# Patient Record
Sex: Female | Born: 1970 | Race: White | Hispanic: No | Marital: Married | Smoking: Never smoker
Health system: Southern US, Community
[De-identification: ages and names within clinical notes are randomized; demographics above are authoritative.]

## PROBLEM LIST (undated history)

## (undated) DIAGNOSIS — M1990 Unspecified osteoarthritis, unspecified site: Secondary | ICD-10-CM

## (undated) DIAGNOSIS — T7840XA Allergy, unspecified, initial encounter: Secondary | ICD-10-CM

## (undated) DIAGNOSIS — E785 Hyperlipidemia, unspecified: Secondary | ICD-10-CM

## (undated) DIAGNOSIS — D649 Anemia, unspecified: Secondary | ICD-10-CM

## (undated) DIAGNOSIS — K589 Irritable bowel syndrome without diarrhea: Secondary | ICD-10-CM

## (undated) DIAGNOSIS — K219 Gastro-esophageal reflux disease without esophagitis: Secondary | ICD-10-CM

## (undated) HISTORY — DX: Anemia, unspecified: D64.9

## (undated) HISTORY — DX: Unspecified osteoarthritis, unspecified site: M19.90

## (undated) HISTORY — DX: Irritable bowel syndrome, unspecified: K58.9

## (undated) HISTORY — DX: Hyperlipidemia, unspecified: E78.5

## (undated) HISTORY — DX: Gastro-esophageal reflux disease without esophagitis: K21.9

## (undated) HISTORY — DX: Allergy, unspecified, initial encounter: T78.40XA

---
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
## 2000-03-07 ENCOUNTER — Emergency Department (HOSPITAL_COMMUNITY): Admission: EM | Admit: 2000-03-07 | Discharge: 2000-03-07 | Payer: Self-pay | Admitting: Emergency Medicine

## 2000-08-06 ENCOUNTER — Other Ambulatory Visit: Admission: RE | Admit: 2000-08-06 | Discharge: 2000-08-06 | Payer: Self-pay | Admitting: *Deleted

## 2001-08-18 ENCOUNTER — Other Ambulatory Visit: Admission: RE | Admit: 2001-08-18 | Discharge: 2001-08-18 | Payer: Self-pay | Admitting: Obstetrics and Gynecology

## 2002-09-14 ENCOUNTER — Other Ambulatory Visit: Admission: RE | Admit: 2002-09-14 | Discharge: 2002-09-14 | Payer: Self-pay | Admitting: Obstetrics & Gynecology

## 2004-01-16 ENCOUNTER — Ambulatory Visit: Payer: Self-pay | Admitting: Family Medicine

## 2004-03-29 ENCOUNTER — Ambulatory Visit: Payer: Self-pay | Admitting: Internal Medicine

## 2004-04-24 ENCOUNTER — Ambulatory Visit: Payer: Self-pay | Admitting: Internal Medicine

## 2004-05-06 ENCOUNTER — Ambulatory Visit: Payer: Self-pay | Admitting: Internal Medicine

## 2004-05-09 ENCOUNTER — Ambulatory Visit: Payer: Self-pay | Admitting: Internal Medicine

## 2005-02-03 ENCOUNTER — Ambulatory Visit: Payer: Self-pay | Admitting: Internal Medicine

## 2005-02-24 ENCOUNTER — Ambulatory Visit: Payer: Self-pay | Admitting: Internal Medicine

## 2005-04-10 ENCOUNTER — Ambulatory Visit: Payer: Self-pay | Admitting: Internal Medicine

## 2005-06-24 ENCOUNTER — Ambulatory Visit: Payer: Self-pay | Admitting: Internal Medicine

## 2005-10-16 ENCOUNTER — Ambulatory Visit: Payer: Self-pay | Admitting: Internal Medicine

## 2005-11-12 ENCOUNTER — Ambulatory Visit: Payer: Self-pay | Admitting: Internal Medicine

## 2006-05-15 DIAGNOSIS — K589 Irritable bowel syndrome without diarrhea: Secondary | ICD-10-CM | POA: Insufficient documentation

## 2006-05-15 DIAGNOSIS — Z9189 Other specified personal risk factors, not elsewhere classified: Secondary | ICD-10-CM | POA: Insufficient documentation

## 2007-01-11 ENCOUNTER — Ambulatory Visit: Payer: Self-pay | Admitting: Internal Medicine

## 2007-01-11 DIAGNOSIS — J019 Acute sinusitis, unspecified: Secondary | ICD-10-CM | POA: Insufficient documentation

## 2007-06-10 ENCOUNTER — Telehealth (INDEPENDENT_AMBULATORY_CARE_PROVIDER_SITE_OTHER): Payer: Self-pay | Admitting: *Deleted

## 2007-11-19 ENCOUNTER — Encounter: Payer: Self-pay | Admitting: Internal Medicine

## 2008-03-27 ENCOUNTER — Telehealth (INDEPENDENT_AMBULATORY_CARE_PROVIDER_SITE_OTHER): Payer: Self-pay | Admitting: *Deleted

## 2008-03-28 ENCOUNTER — Ambulatory Visit: Payer: Self-pay | Admitting: Internal Medicine

## 2008-03-28 DIAGNOSIS — K3189 Other diseases of stomach and duodenum: Secondary | ICD-10-CM | POA: Insufficient documentation

## 2008-03-28 DIAGNOSIS — R1013 Epigastric pain: Secondary | ICD-10-CM

## 2008-03-28 DIAGNOSIS — E785 Hyperlipidemia, unspecified: Secondary | ICD-10-CM | POA: Insufficient documentation

## 2008-03-28 LAB — CONVERTED CEMR LAB: HDL goal, serum: 40 mg/dL

## 2009-04-13 ENCOUNTER — Encounter: Admission: RE | Admit: 2009-04-13 | Discharge: 2009-04-13 | Payer: Self-pay | Admitting: Sports Medicine

## 2009-06-06 ENCOUNTER — Ambulatory Visit: Payer: Self-pay | Admitting: Internal Medicine

## 2009-06-06 DIAGNOSIS — M2550 Pain in unspecified joint: Secondary | ICD-10-CM | POA: Insufficient documentation

## 2009-06-11 LAB — CONVERTED CEMR LAB: Uric Acid, Serum: 5.4 mg/dL (ref 2.4–7.0)

## 2010-03-21 NOTE — Assessment & Plan Note (Signed)
Summary: pain in both hands/kdc   Vital Signs:  Patient profile:   40 year old female Weight:      130.4 pounds Temp:     98.3 degrees F oral Pulse rate:   60 / minute Resp:     14 per minute BP sitting:   104 / 68  (left arm) Cuff size:   regular  Vitals Entered By: Shonna Chock (June 06, 2009 3:07 PM) CC: 1.) Pain in both hands (severe x 3 weeks)  2.)Pain in both little toes-feels inflammed Comments REVIEWED MED LIST, PATIENT AGREED DOSE AND INSTRUCTION CORRECT    CC:  1.) Pain in both hands (severe x 3 weeks)  2.)Pain in both little toes-feels inflammed.  History of Present Illness: Onset of aching pain in hands X 3 weeks, sharp with typing & using mouse.Small toes have  ached for same period but are improving . EA:VWUJW  or hot water &  NSAIDS with minimal benefit . Her mother  & MGM have  arthritis  diffusely.   Allergies (verified): No Known Drug Allergies  Review of Systems General:  Denies chills, fever, and sweats. Eyes:  Denies discharge and eye pain. CV:  Denies bluish discoloration of lips or nails; No Raynaud's  symptoms. GI:  Denies change in bowel habits, constipation, diarrhea, and yellowish skin color; no dark urine. GU:  Denies discharge and dysuria. MS:  Complains of joint pain and joint swelling; denies joint redness, low back pain, mid back pain, and thoracic pain. Derm:  Denies lesion(s) and rash.  Physical Exam  General:  well-nourished,in no acute distress; alert,appropriate and cooperative throughout examination Eyes:  No corneal or conjunctival inflammation noted.No icterus.Dorian Heckle Extremities:  No clubbing, cyanosis, edema, or deformity noted with normal full range of motion of all joints. Minor crepitus of knees w/o effusion  Skin:  Intact without suspicious lesions or rashes. No jaundice Cervical Nodes:  No lymphadenopathy noted Axillary Nodes:  No palpable lymphadenopathy   Impression & Recommendations:  Problem # 1:  ARTHRALGIA  (ICD-719.40)  Orders: Venipuncture (11914) TLB-Uric Acid, Blood (84550-URIC) TLB-Sedimentation Rate (ESR) (85652-ESR) TLB-Rheumatoid Factor (RA) (78295-AO)  Complete Medication List: 1)  Mucinex Nasal Spray  .... Prn 2)  Nasonex 50 Mcg/act Susp (Mometasone furoate) .... Use 2 sprays in each nostril daily 3)  Tramadol Hcl 50 Mg Tabs (Tramadol hcl) .Marland Kitchen.. 1 every 6 hrs as needed for pain  Patient Instructions: 1)  Glucosamine sulfate 1500 mg once daily X 3 months. This can be used cyclically , 3 mos on & 2 moths off. Prescriptions: TRAMADOL HCL 50 MG TABS (TRAMADOL HCL) 1 every 6 hrs as needed for pain  #30 x 1   Entered and Authorized by:   Marga Melnick MD   Signed by:   Marga Melnick MD on 06/06/2009   Method used:   Faxed to ...       [facility] # [address]* (retail)       [address]       [place], [place]  [address]       Ph: [phone]       Fax: [phone]   RxID:   [id number]

## 2010-05-10 ENCOUNTER — Encounter: Payer: Self-pay | Admitting: Internal Medicine

## 2012-05-27 ENCOUNTER — Encounter: Payer: Self-pay | Admitting: Internal Medicine

## 2013-06-09 ENCOUNTER — Encounter: Payer: Self-pay | Admitting: Internal Medicine

## 2015-11-02 DIAGNOSIS — J40 Bronchitis, not specified as acute or chronic: Secondary | ICD-10-CM | POA: Diagnosis not present

## 2015-11-02 DIAGNOSIS — J0110 Acute frontal sinusitis, unspecified: Secondary | ICD-10-CM | POA: Diagnosis not present

## 2015-11-09 DIAGNOSIS — Z6826 Body mass index (BMI) 26.0-26.9, adult: Secondary | ICD-10-CM | POA: Diagnosis not present

## 2015-11-09 DIAGNOSIS — Z01419 Encounter for gynecological examination (general) (routine) without abnormal findings: Secondary | ICD-10-CM | POA: Diagnosis not present

## 2015-11-09 DIAGNOSIS — Z1231 Encounter for screening mammogram for malignant neoplasm of breast: Secondary | ICD-10-CM | POA: Diagnosis not present

## 2015-11-19 LAB — HM MAMMOGRAPHY: HM Mammogram: NORMAL (ref 0–4)

## 2015-11-27 DIAGNOSIS — J0140 Acute pansinusitis, unspecified: Secondary | ICD-10-CM | POA: Diagnosis not present

## 2015-11-27 DIAGNOSIS — Z23 Encounter for immunization: Secondary | ICD-10-CM | POA: Diagnosis not present

## 2016-02-23 DIAGNOSIS — J0141 Acute recurrent pansinusitis: Secondary | ICD-10-CM | POA: Diagnosis not present

## 2016-02-29 DIAGNOSIS — J0100 Acute maxillary sinusitis, unspecified: Secondary | ICD-10-CM | POA: Diagnosis not present

## 2016-03-27 DIAGNOSIS — J329 Chronic sinusitis, unspecified: Secondary | ICD-10-CM | POA: Diagnosis not present

## 2016-07-11 DIAGNOSIS — Z0000 Encounter for general adult medical examination without abnormal findings: Secondary | ICD-10-CM | POA: Diagnosis not present

## 2016-07-12 LAB — HEPATIC FUNCTION PANEL
ALK PHOS: 44 (ref 25–125)
ALT: 15 (ref 7–35)
AST: 17 (ref 13–35)
Bilirubin, Direct: 0.2 (ref 0.01–0.4)

## 2016-07-12 LAB — BASIC METABOLIC PANEL
BUN: 16 (ref 4–21)
Creatinine: 0.8 (ref 0.5–1.1)
GLUCOSE: 100
POTASSIUM: 4.6 (ref 3.4–5.3)
Sodium: 141 (ref 137–147)

## 2016-07-12 LAB — CBC AND DIFFERENTIAL
HEMATOCRIT: 39 (ref 36–46)
Hemoglobin: 13 (ref 12.0–16.0)
Neutrophils Absolute: 43
Platelets: 306 (ref 150–399)
WBC: 6.9

## 2016-07-12 LAB — TSH: TSH: 2.19 (ref 0.41–5.90)

## 2016-07-12 LAB — LIPID PANEL
CHOLESTEROL: 268 — AB (ref 0–200)
HDL: 49 (ref 35–70)
LDL Cholesterol: 190
LDl/HDL Ratio: 3.9
Triglycerides: 144 (ref 40–160)

## 2016-07-25 DIAGNOSIS — Z713 Dietary counseling and surveillance: Secondary | ICD-10-CM | POA: Diagnosis not present

## 2016-08-04 ENCOUNTER — Encounter: Payer: Self-pay | Admitting: Family Medicine

## 2016-08-04 ENCOUNTER — Encounter: Payer: Self-pay | Admitting: *Deleted

## 2016-08-04 ENCOUNTER — Ambulatory Visit (INDEPENDENT_AMBULATORY_CARE_PROVIDER_SITE_OTHER): Payer: BLUE CROSS/BLUE SHIELD | Admitting: Family Medicine

## 2016-08-04 VITALS — BP 130/88 | HR 56 | Temp 98.4°F | Resp 20 | Ht 60.5 in | Wt 136.8 lb

## 2016-08-04 DIAGNOSIS — Z7689 Persons encountering health services in other specified circumstances: Secondary | ICD-10-CM | POA: Diagnosis not present

## 2016-08-04 DIAGNOSIS — Z6826 Body mass index (BMI) 26.0-26.9, adult: Secondary | ICD-10-CM

## 2016-08-04 DIAGNOSIS — E663 Overweight: Secondary | ICD-10-CM | POA: Insufficient documentation

## 2016-08-04 DIAGNOSIS — E785 Hyperlipidemia, unspecified: Secondary | ICD-10-CM

## 2016-08-04 NOTE — Patient Instructions (Addendum)
Fish oil supplement 1000 -2000 mg a day.   Exercise greater than 150 min of good cardio a week, more is better, but start here.   Myfittnesspal app download.   HDL (good cholesterol):olive oil, salmon, whole grains, beans/legumes and exercise.  URL to calorie calculator: http://www.calculator.net/calorie-calculator.html  Follow up in 6 months with fasting labs at that appt.     Food Choices to Lower Your Cholesterol Triglycerides are a type of fat in your blood. High levels of triglycerides can increase the risk of heart disease and stroke. If your triglyceride levels are high, the foods you eat and your eating habits are very important. Choosing the right foods can help lower your triglycerides. What general guidelines do I need to follow?  Lose weight if you are overweight.  Limit or avoid alcohol.  Fill one half of your plate with vegetables and green salads.  Limit fruit to two servings a day. Choose fruit instead of juice.  Make one fourth of your plate whole grains. Look for the word "whole" as the first word in the ingredient list.  Fill one fourth of your plate with lean protein foods.  Enjoy fatty fish (such as salmon, mackerel, sardines, and tuna) three times a week.  Choose healthy fats.  Limit foods high in starch and sugar.  Eat more home-cooked food and less restaurant, buffet, and fast food.  Limit fried foods.  Cook foods using methods other than frying.  Limit saturated fats.  Check ingredient lists to avoid foods with partially hydrogenated oils (trans fats) in them. What foods can I eat? Grains Whole grains, such as whole wheat or whole grain breads, crackers, cereals, and pasta. Unsweetened oatmeal, bulgur, barley, quinoa, or brown rice. Corn or whole wheat flour tortillas. Vegetables Fresh or frozen vegetables (raw, steamed, roasted, or grilled). Green salads. Fruits All fresh, canned (in natural juice), or frozen fruits. Meat and Other  Protein Products Ground beef (85% or leaner), grass-fed beef, or beef trimmed of fat. Skinless chicken or [place]. Ground chicken or [place]. Pork trimmed of fat. All fish and seafood. Eggs. Dried beans, peas, or lentils. Unsalted nuts or seeds. Unsalted canned or dry beans. Dairy Low-fat dairy products, such as skim or 1% milk, 2% or reduced-fat cheeses, low-fat ricotta or cottage cheese, or plain low-fat yogurt. Fats and Oils Tub margarines without trans fats. Light or reduced-fat mayonnaise and salad dressings. Avocado. Safflower, olive, or canola oils. Natural peanut or almond butter. The items listed above may not be a complete list of recommended foods or beverages. Contact your dietitian for more options. What foods are not recommended? Grains White bread. White pasta. White rice. Cornbread. Bagels, pastries, and croissants. Crackers that contain trans fat. Vegetables White potatoes. Corn. Creamed or fried vegetables. Vegetables in a cheese sauce. Fruits Dried fruits. Canned fruit in light or heavy syrup. Fruit juice. Meat and Other Protein Products Fatty cuts of meat. Ribs, chicken wings, bacon, sausage, bologna, salami, chitterlings, fatback, hot dogs, bratwurst, and packaged luncheon meats. Dairy Whole or 2% milk, cream, half-and-half, and cream cheese. Whole-fat or sweetened yogurt. Full-fat cheeses. Nondairy creamers and whipped toppings. Processed cheese, cheese spreads, or cheese curds. Sweets and Desserts Corn syrup, sugars, honey, and molasses. Candy. Jam and jelly. Syrup. Sweetened cereals. Cookies, pies, cakes, donuts, muffins, and ice cream. Fats and Oils Butter, stick margarine, lard, shortening, ghee, or bacon fat. Coconut, palm kernel, or palm oils. Beverages Alcohol. Sweetened drinks (such as sodas, lemonade, and fruit drinks or punches). The items listed  above may not be a complete list of foods and beverages to avoid. Contact your dietitian for more information. This  information is not intended to replace advice given to you by your health care provider. Make sure you discuss any questions you have with your health care provider. Document Released: 11/22/2003 Document Revised: 07/12/2015 Document Reviewed: 12/08/2012 Elsevier Interactive Patient Education  2017 [place].   Please help [place] help you:  We are honored you have chosen [facility] for your Primary Care home. Below you will find basic instructions that you may need to access in the future. Please help [place] help you by reading the instructions, which cover many of the frequent questions we experience.   Prescription refills and request:  -In order to allow more efficient response time, please call your pharmacy for all refills. They will forward the request electronically to [place]. This allows for the quickest possible response. Request left on a nurse line can take longer to refill, since these are checked as time allows between office patients and other phone calls.  - refill request can take up to 3-5 working days to complete.  - If request is sent electronically and request is appropiate, it is usually completed in 1-2 business days.  - all patients will need to be seen routinely for all chronic medical conditions requiring prescription medications (see follow-up below). If you are overdue for follow up on your condition, you will be asked to make an appointment and we will call in enough medication to cover you until your appointment (up to 30 days).  - all controlled substances will require a face to face visit to request/refill.  - if you desire your prescriptions to go through a new pharmacy, and have an active script at original pharmacy, you will need to call your pharmacy and have scripts transferred to new pharmacy. This is completed between the pharmacy locations and not by your provider.    Results: If any images or labs were ordered, it can take up to 1 week to get results depending  on the test ordered and the lab/facility running and resulting the test. - Normal or stable results, which do not need further discussion, may be released to your mychart immediately with attached note to you. A call may not be generated for normal results. Please make certain to sign up for mychart. If you have questions on how to activate your mychart you can call the front office.  - If your results need further discussion, our office will attempt to contact you via phone, and if unable to reach you after 2 attempts, we will release your abnormal result to your mychart with instructions.  - All results will be automatically released in mychart after 1 week.  - Your provider will provide you with explanation and instruction on all relevant material in your results. Please keep in mind, results and labs may appear confusing or abnormal to the untrained eye, but it does not mean they are actually abnormal for you personally. If you have any questions about your results that are not covered, or you desire more detailed explanation than what was provided, you should make an appointment with your provider to do so.   Our office handles many outgoing and incoming calls daily. If we have not contacted you within 1 week about your results, please check your mychart to see if there is a message first and if not, then contact our office.  In helping with this matter,  you help decrease call volume, and therefore allow [place] to be able to respond to patients needs more efficiently.   Acute office visits (sick visit):  An acute visit is intended for a new problem and are scheduled in shorter time slots to allow schedule openings for patients with new problems. This is the appropriate visit to discuss a new problem. In order to provide you with excellent quality medical care with proper time for you to explain your problem, have an exam and receive treatment with instructions, these appointments should be limited to one  new problem per visit. If you experience a new problem, in which you desire to be addressed, please make an acute office visit, we save openings on the schedule to accommodate you. Please do not save your new problem for any other type of visit, let us take care of it properly and quickly for you.   Follow up visits:  Depending on your condition(s) your provider will need to see you routinely in order to provide you with quality care and prescribe medication(s). Most chronic conditions (Example: hypertension, Diabetes, depression/anxiety... etc), require visits a couple times a year. Your provider will instruct you on proper follow up for your personal medical conditions and history. Please make certain to make follow up appointments for your condition as instructed. Failing to do so could result in lapse in your medication treatment/refills. If you request a refill, and are overdue to be seen on a condition, we will always provide you with a 30 day script (once) to allow you time to schedule.    Medicare wellness (well visit): - we have a wonderful Nurse Maudie Mercury), that will meet with you and provide you will yearly medicare wellness visits. These visits should occur yearly (can not be scheduled less than 1 calendar year apart) and cover preventive health, immunizations, advance directives and screenings you are entitled to yearly through your medicare benefits. Do not miss out on your entitled benefits, this is when medicare will pay for these benefits to be ordered for you.  These are strongly encouraged by your provider and is the appropriate type of visit to make certain you are up to date with all preventive health benefits. If you have not had your medicare wellness exam in the last 12 months, please make certain to schedule one by calling the office and schedule your medicare wellness with Maudie Mercury as soon as possible.   Yearly physical (well visit):  - Adults are recommended to be seen yearly for  physicals. Check with your insurance and date of your last physical, most insurances require one calendar year between physicals. Physicals include all preventive health topics, screenings, medical exam and labs that are appropriate for gender/age and history. You may have fasting labs needed at this visit. This is a well visit (not a sick visit), new problems should not be covered during this visit (see acute visit).  - Pediatric patients are seen more frequently when they are younger. Your provider will advise you on well child visit timing that is appropriate for your their age. - This is not a medicare wellness visit. Medicare wellness exams do not have an exam portion to the visit. Some medicare companies allow for a physical, some do not allow a yearly physical. If your medicare allows a yearly physical you can schedule the medicare wellness with our nurse Maudie Mercury and have your physical with your provider after, on the same day. Please check with insurance for your full benefits.  Late Policy/No Shows:  - all new patients should arrive 15-30 minutes earlier than appointment to allow [place] time  to  obtain all personal demographics,  insurance information and for you to complete office paperwork. - All established patients should arrive 10-15 minutes earlier than appointment time to update all information and be checked in .  - In our best efforts to run on time, if you are late for your appointment you will be asked to either reschedule or if able, we will work you back into the schedule. There will be a wait time to work you back in the schedule,  depending on availability.  - If you are unable to make it to your appointment as scheduled, please call 24 hours ahead of time to allow [place] to fill the time slot with someone else who needs to be seen. If you do not cancel your appointment ahead of time, you may be charged a no show fee.

## 2016-08-04 NOTE — Progress Notes (Addendum)
Patient ID: Shelly Hernandez, female  DOB: 01-04-1971, 46 y.o.   MRN: [id number] Patient Care Team    Relationship Specialty Notifications Start End  Ma Hillock, DO PCP - General Family Medicine  08/04/16   Brien Few, MD Consulting Physician Obstetrics and Gynecology  08/04/16     Chief Complaint  Patient presents with  . Establish Care    Subjective:  Shelly Hernandez is a 46 y.o.  female present for new patient establishment. All past medical history, surgical history, allergies, family history, immunizations, medications and social history were obtained and entered in the electronic medical record today. All recent labs, ED visits and hospitalizations within the last year were reviewed.  Hyperlipidemia:  Pt brings with her outside labs collected at her employment with elevated cholesterol panel. She states she has been watching her cholesterol over the last few years and has tried to control it with diet and exercise. Her family has a history of elevated cholesterol and hypertension so she has concerns that her last collection was increasing more. She was seen by a nutritionist to give guidance on hyperlipidemia and diet. Total cholesterol 268, trig 144, LDL 190 in May 2018. TSH, CBC unremarkable.   Health maintenance:  Colonoscopy: No fhx, routine screen at 49 Mammogram: completed:11/2015, by GYN, Dr. Edwyna Ready which she follows routinely. Cervical cancer screening: last pap: 11/18/2015, Follows with GYn, Dr Edwyna Ready.  Immunizations: tdap unknown, likely received during pregnancy, Influenza  (encouraged yearly) Infectious disease screening: HIV unknown screening, likely completed with pregnancy.  DEXA: N/A   Depression screen [facility] 2/9 08/04/2016  Decreased Interest 0  Down, Depressed, Hopeless 0  PHQ - 2 Score 0   No flowsheet data found.   Current Exercise Habits: Structured exercise class;Home exercise routine, Type of exercise: walking, Time (Minutes): 45, Frequency  (Times/Week): 3, Weekly Exercise (Minutes/Week): 135, Intensity: Moderate Exercise limited by: None identified Fall Risk  08/04/2016  Falls in the past year? No    There is no immunization history on file for this patient.  No exam data present  Past Medical History:  Diagnosis Date  . Allergy   . Hyperlipidemia LDL goal <130   . IBS (irritable bowel syndrome)    Allergies  Allergen Reactions  . Augmentin [Amoxicillin-Pot Clavulanate] Diarrhea   Past Surgical History:  Procedure Laterality Date  . REDUCTION MAMMAPLASTY  1990   Family History  Problem Relation Age of Onset  . Diabetes Mother   . Arthritis Mother   . Diabetes Father   . Hearing loss Father   . Heart disease Father   . Bipolar disorder Son   . Diabetes Maternal Aunt   . Arthritis Maternal Aunt   . Heart disease Maternal Uncle   . Diabetes Paternal Aunt   . Stroke Maternal Grandmother   . Arthritis Maternal Grandmother   . Esophageal cancer Maternal Grandfather   . COPD Maternal Grandfather   . Diabetes Paternal Grandmother   . Heart disease Paternal Grandfather    Social History   Social History  . Marital status: Married    Spouse name: Herbie Baltimore  . Number of children: 1  . Years of education: 65   Occupational History  . Banker    Social History Main Topics  . Smoking status: Never Smoker  . Smokeless tobacco: Never Used  . Alcohol use 3.6 oz/week    4 Cans of beer, 2 Shots of liquor per week  . Drug use: No  . Sexual activity:  Yes    Partners: Male    Birth control/ protection: IUD   Other Topics Concern  . Not on file   Social History Narrative   Married to [place]. 1 son Hulen Skains.    B.A. Degree, works as a Customer service manager.    Drinks caffeine. Daily vitamin use.   Wears her seatbelt, bicycle helmet, smoke detector in the home. Firearms locked in the home.    Feels safe in her relationships.    Allergies as of 08/04/2016      Reactions   Augmentin [amoxicillin-pot Clavulanate] Diarrhea       Medication List       Accurate as of 08/04/16 11:56 AM. Always use your most recent med list.          cetirizine 10 MG tablet Commonly known as:  ZYRTEC Take 10 mg by mouth daily.   fluticasone 50 MCG/ACT nasal spray Commonly known as:  FLONASE Place into the nose.       All past medical history, surgical history, allergies, family history, immunizations andmedications were updated in the EMR today and reviewed under the history and medication portions of their EMR.    Recent Results (from the past 2160 hour(s))  CBC and differential     Status: None   Collection Time: 07/12/16 12:00 AM  Result Value Ref Range   Hemoglobin 13.0 12.0 - 16.0   HCT 39 36 - 46   Neutrophils Absolute 43    Platelets 306 150 - 399   WBC 6.9   Basic metabolic panel     Status: None   Collection Time: 07/12/16 12:00 AM  Result Value Ref Range   Glucose 100    BUN 16 4 - 21   Creatinine 0.8 0.5 - 1.1   Potassium 4.6 3.4 - 5.3   Sodium 141 137 - 147  Lipid panel     Status: Abnormal   Collection Time: 07/12/16 12:00 AM  Result Value Ref Range   LDl/HDL Ratio 3.9    Triglycerides 144 40 - 160   Cholesterol 268 (A) 0 - 200   HDL 49 35 - 70   LDL Cholesterol 190   Hepatic function panel     Status: None   Collection Time: 07/12/16 12:00 AM  Result Value Ref Range   Alkaline Phosphatase 44 25 - 125   ALT 15 7 - 35   AST 17 13 - 35   Bilirubin, Direct 0.2 0.01 - 0.4  TSH     Status: None   Collection Time: 07/12/16 12:00 AM  Result Value Ref Range   TSH 2.19 0.41 - 5.90    No results found.   ROS: 14 pt review of systems performed and negative (unless mentioned in an HPI)  Objective: BP 130/88 (BP Location: Left Arm, Patient Position: Sitting, Cuff Size: Normal)   Pulse (!) 56   Temp 98.4 F (36.9 C)   Resp 20   Ht 5' 0.5" (1.537 m)   Wt 136 lb 12 oz (62 kg)   LMP 07/04/2016   SpO2 97%   BMI 26.27 kg/m  Gen: Afebrile. No acute distress. Nontoxic in appearance,  well-developed, well-nourished,  Very pleasant caucasian female.  HENT: AT. Page. MMM, no oral lesions Eyes:Pupils Equal Round Reactive to light, Extraocular movements intact,  Conjunctiva without redness, discharge or icterus. Neck/lymp/endocrine: Supple,no lymphadenopathy, no thyromegaly CV: RRR no murmur, no edema, +2/4 P posterior tibialis pulses. no carotid bruits. No JVD. Chest: CTAB, no wheeze, rhonchi or  crackles.  Abd: Soft. NTND. BS present. Skin: Warm and well-perfused. Skin intact. Neuro/Msk:  Normal gait. PERLA. EOMi. Alert. Oriented x3.   Psych: Normal affect, dress and demeanor. Normal speech. Normal thought content and judgment.   Assessment/plan: TARIKA MCKETHAN is a 46 y.o. female present to establish care and discuss her hyperlipidemia.  Hyperlipidemia LDL goal <130 BMI 26.0-26.9,adult - pt reports elevated cholesterol in the past, but recent labs suggest even higher counts than prior.  - in depth discussion in diet low in saturated fats, high in fiber and "good" forms of cholesterol, exercise > 150 min a week to start, calorie calculator, myfittness pal app.  - start fish oil supplement 1000-2000 mg  A day.  - she will follow up in 6 months with repeat fasting labs that day.    Return in about 6 months (around 02/03/2017) for hyperlipidemia.   Note is dictated utilizing voice recognition software. Although note has been proof read prior to signing, occasional typographical errors still can be missed. If any questions arise, please do not hesitate to call for verification.  Electronically signed by: Howard Pouch, DO [place]

## 2016-09-05 DIAGNOSIS — Z713 Dietary counseling and surveillance: Secondary | ICD-10-CM | POA: Diagnosis not present

## 2016-11-28 DIAGNOSIS — Z23 Encounter for immunization: Secondary | ICD-10-CM | POA: Diagnosis not present

## 2016-12-31 DIAGNOSIS — M25511 Pain in right shoulder: Secondary | ICD-10-CM | POA: Diagnosis not present

## 2017-01-06 DIAGNOSIS — Z1151 Encounter for screening for human papillomavirus (HPV): Secondary | ICD-10-CM | POA: Diagnosis not present

## 2017-01-06 DIAGNOSIS — Z01419 Encounter for gynecological examination (general) (routine) without abnormal findings: Secondary | ICD-10-CM | POA: Diagnosis not present

## 2017-01-06 DIAGNOSIS — Z1231 Encounter for screening mammogram for malignant neoplasm of breast: Secondary | ICD-10-CM | POA: Diagnosis not present

## 2017-01-06 DIAGNOSIS — Z6827 Body mass index (BMI) 27.0-27.9, adult: Secondary | ICD-10-CM | POA: Diagnosis not present

## 2017-01-06 LAB — HM MAMMOGRAPHY

## 2017-01-13 ENCOUNTER — Encounter: Payer: Self-pay | Admitting: *Deleted

## 2017-01-13 DIAGNOSIS — M25511 Pain in right shoulder: Secondary | ICD-10-CM | POA: Diagnosis not present

## 2017-01-13 DIAGNOSIS — M25512 Pain in left shoulder: Secondary | ICD-10-CM | POA: Diagnosis not present

## 2017-02-03 ENCOUNTER — Ambulatory Visit: Payer: BLUE CROSS/BLUE SHIELD | Admitting: Family Medicine

## 2017-02-03 ENCOUNTER — Encounter: Payer: Self-pay | Admitting: Family Medicine

## 2017-02-03 VITALS — BP 122/73 | HR 57 | Temp 97.9°F | Wt 134.0 lb

## 2017-02-03 DIAGNOSIS — E785 Hyperlipidemia, unspecified: Secondary | ICD-10-CM | POA: Diagnosis not present

## 2017-02-03 DIAGNOSIS — Z713 Dietary counseling and surveillance: Secondary | ICD-10-CM

## 2017-02-03 DIAGNOSIS — E663 Overweight: Secondary | ICD-10-CM | POA: Diagnosis not present

## 2017-02-03 LAB — LIPID PANEL
CHOLESTEROL: 186 mg/dL (ref 0–200)
HDL: 58.3 mg/dL (ref >39.00)
LDL Cholesterol: 111 mg/dL — ABNORMAL HIGH (ref 0–99)
NONHDL: 127.28
Total CHOL/HDL Ratio: 3
Triglycerides: 79 mg/dL (ref 0.0–149.0)
VLDL: 15.8 mg/dL (ref 0.0–40.0)

## 2017-02-03 NOTE — Progress Notes (Signed)
Patient ID: Shelly Hernandez, female  DOB: [birth year], 46 y.o.   MRN: [id number] Patient Care Team    Relationship Specialty Notifications Start End  Ma Hillock, DO PCP - General Family Medicine  08/04/16   Brien Few, MD Consulting Physician Obstetrics and Gynecology  08/04/16   Melida Quitter, MD Consulting Physician Otolaryngology  02/03/17     Chief Complaint  Patient presents with  . Hyperlipidemia    follow up    Subjective:  Shelly Hernandez is a 46 y.o.  female present for follow up 6 months on new hyperlipdemia  Hyperlipidemia: Patient was advised on lifestyle changes and start fish oil supplement of 1-2 g 6 months ago after hyperlipidemia diagnosis. Her total chol 268, HDL 49, LDL 190, tg 144 07/12/2016. She had also been counseled by nutrition. Fhx of heart disease, HTN, hyperlipidemia and stroke.  She reports starting 1000 mg fish oil supplement QD. She has tried many different diets and still feels confused on what foods to eat to lose weight and be healthy. She has seen nutrition in counseled, and was advised to smaller more frequent meals. She has tried low carbohydrate, feel free, gluten-free now she has moved onto a pescatarian like diet. She reports she'll still eat me on rare occasions when eating in a group and it is prepared. She has not maintain routine exercise. She reports is very difficult for her to wedge out time 2 workout. She tried myfittnesspal app, but felt that she was over obcessing about her calories.    Prior Note 08/04/2016: Pt brings with her outside labs collected at her employment with elevated cholesterol panel. She states she has been watching her cholesterol over the last few years and has tried to control it with diet and exercise. Her family has a history of elevated cholesterol and hypertension so she has concerns that her last collection was increasing more. She was seen by a nutritionist to give guidance on hyperlipidemia and diet. Total  cholesterol 268, trig 144, LDL 190 in May 2018. TSH, CBC unremarkable.    Depression screen [facility] 2/9 08/04/2016  Decreased Interest 0  Down, Depressed, Hopeless 0  PHQ - 2 Score 0   No flowsheet data found.       Fall Risk  08/04/2016  Falls in the past year? No    There is no immunization history on file for this patient.  No exam data present  Past Medical History:  Diagnosis Date  . Allergy   . Hyperlipidemia LDL goal <130   . IBS (irritable bowel syndrome)    Allergies  Allergen Reactions  . Augmentin [Amoxicillin-Pot Clavulanate] Diarrhea   Past Surgical History:  Procedure Laterality Date  . REDUCTION MAMMAPLASTY  1990   Family History  Problem Relation Age of Onset  . Diabetes Mother   . Arthritis Mother   . Diabetes Father   . Hearing loss Father   . Heart disease Father   . Bipolar disorder Son   . Diabetes Maternal Aunt   . Arthritis Maternal Aunt   . Heart disease Maternal Uncle   . Diabetes Paternal Aunt   . Stroke Maternal Grandmother   . Arthritis Maternal Grandmother   . Esophageal cancer Maternal Grandfather   . COPD Maternal Grandfather   . Diabetes Paternal Grandmother   . Heart disease Paternal Grandfather    Social History   Socioeconomic History  . Marital status: Married    Spouse name: Herbie [place]  . Number  of children: 1  . Years of education: 10  . Highest education level: Not on file  Social Needs  . Financial resource strain: Not on file  . Food insecurity - worry: Not on file  . Food insecurity - inability: Not on file  . Transportation needs - medical: Not on file  . Transportation needs - non-medical: Not on file  Occupational History  . Occupation: Banker  Tobacco Use  . Smoking status: Never Smoker  . Smokeless tobacco: Never Used  Substance and Sexual Activity  . Alcohol use: Yes    Alcohol/week: 3.6 oz    Types: 4 Cans of beer, 2 Shots of liquor per week  . Drug use: No  . Sexual activity: Yes    Partners: Male      Birth control/protection: IUD  Other Topics Concern  . Not on file  Social History Narrative   Married to [place]. 1 son Hulen Skains.    B.A. Degree, works as a Customer service manager.    Drinks caffeine. Daily vitamin use.   Wears her seatbelt, bicycle helmet, smoke detector in the home. Firearms locked in the home.    Feels safe in her relationships.    Allergies as of 02/03/2017      Reactions   Augmentin [amoxicillin-pot Clavulanate] Diarrhea      Medication List        Accurate as of 02/03/17 11:47 AM. Always use your most recent med list.          ALEVE 220 MG tablet Generic drug:  naproxen sodium Take 220 mg by mouth daily.   BENADRYL ALLERGY PO Take 25 mg by mouth daily.   fluticasone 50 MCG/ACT nasal spray Commonly known as:  FLONASE Place into the nose.   Omega 3 1000 MG Caps Take 1 capsule by mouth daily.   pseudoephedrine 30 MG tablet Commonly known as:  SUDAFED Take 30 mg by mouth daily.   TURMERIC PO Take 2 tablets by mouth daily.       All past medical history, surgical history, allergies, family history, immunizations andmedications were updated in the EMR today and reviewed under the history and medication portions of their EMR.    Recent Results (from the past 2160 hour(s))  HM MAMMOGRAPHY     Status: None   Collection Time: 01/06/17 12:00 AM  Result Value Ref Range   HM Mammogram 0-4 Bi-Rad 0-4 Bi-Rad, Self Reported Normal    No results found.   ROS: 14 pt review of systems performed and negative (unless mentioned in an HPI)  Objective: BP 122/73 (BP Location: Right Arm, Patient Position: Sitting, Cuff Size: Normal)   Pulse (!) 57   Temp 97.9 F (36.6 C) (Oral)   Wt 134 lb (60.8 kg)   SpO2 97%   BMI 25.74 kg/m  Gen: Afebrile. No acute distress. Nontoxic in appearance, well-developed, well-nourished, Caucasian female. HENT: AT. [place].  MMM. Eyes:Pupils Equal Round Reactive to light, Extraocular movements intact,  Conjunctiva without redness,  discharge or icterus. CV: RRR no murmur, no edema, +2/4 P posterior tibialis pulses Chest: CTAB, no wheeze or crackles Abd: Soft. NTND. BS present. No Masses palpated.  Neuro:  Normal gait. PERLA. EOMi. Alert. Oriented x3   Assessment/plan: Shelly Hernandez is a 46 y.o. female present to  Hyperlipidemia LDL goal <130 Overweight (BMI 25.0-29.9) Nutritional counseling - Again, lengthy conversation surrounding nutrition, diet and exercise lifestyle modifications needed in order to maintain a healthy diet, lower cholesterol and lose weight. Counseled on normal  BMI is below 25. She has lost 3 pounds. - Strongly encourage her to eat small more frequent meals and start routine exercise greater than 150 minutes a week. - Continue watching her calories, calories in and calories burned her appointment when trying to lose weight. - Discussed healthy diet. She plans to be mostly vegetable and fish base diet, without routine meats would encourage her to make sure to take a multivitamin with B 12 and iron.  - Discussed glycemic index with her today. Her body's needs carbohydrates and fats. Feel. However it is healthier to get these from specific sources. [facility] education on nutrition and glycemic index printed and provided to patient today. URL for a nutrition website so that she can compare calories and glycemic indexes of different foods to help her learn, was provided to her today. - Continue fish oil 1000 mg for now, lipid panel collected today. Patient will be guided on further management once lab results are collected. - Follow-up 6 months, this will likely be her physical.   No Follow-up on file.  > 25 minutes spent with patient, >50% of time spent face to face counseling/educating  Note is dictated utilizing voice recognition software. Although note has been proof read prior to signing, occasional typographical errors still can be missed. If any questions arise, please do not hesitate to  call for verification.  Electronically signed by: Howard Pouch, DO [place]

## 2017-02-03 NOTE — Patient Instructions (Addendum)
We will call you with the results of your lab once we receive them.  [email or web address]     Cholesterol Cholesterol is a fat. Your body needs a small amount of cholesterol. Cholesterol (plaque) may build up in your blood vessels (arteries). That makes you more likely to have a heart attack or stroke. You cannot feel your cholesterol level. Having a blood test is the only way to find out if your level is high. Keep your test results. Work with your doctor to keep your cholesterol at a good level. What do the results mean?  Total cholesterol is how much cholesterol is in your blood.  LDL is bad cholesterol. This is the type that can build up. Try to have low LDL.  HDL is good cholesterol. It cleans your blood vessels and carries LDL away. Try to have high HDL.  Triglycerides are fat that the body can store or burn for energy. What are good levels of cholesterol?  Total cholesterol below 200.  LDL below 100 is good for people who have health risks. LDL below 70 is good for people who have very high risks.  HDL above 40 is good. It is best to have HDL of 60 or higher.  Triglycerides below 150. How can I lower my cholesterol? Diet Follow your diet program as told by your doctor.  Choose fish, white meat chicken, or [place] that is roasted or baked. Try not to eat red meat, fried foods, sausage, or lunch meats.  Eat lots of fresh fruits and vegetables.  Choose whole grains, beans, pasta, potatoes, and cereals.  Choose olive oil, corn oil, or canola oil. Only use small amounts.  Try not to eat butter, mayonnaise, shortening, or palm kernel oils.  Try not to eat foods with trans fats.  Choose low-fat or nonfat dairy foods. ? Drink skim or nonfat milk. ? Eat low-fat or nonfat yogurt and cheeses. ? Try not to drink whole milk or cream. ? Try not to eat ice cream, egg yolks, or full-fat cheeses.  Healthy desserts include angel food  cake, ginger snaps, animal crackers, hard candy, popsicles, and low-fat or nonfat frozen yogurt. Try not to eat pastries, cakes, pies, and cookies.  Exercise Follow your exercise program as told by your doctor.  Be more active. Try gardening, walking, and taking the stairs.  Ask your doctor about ways that you can be more active.  Medicine  Take over-the-counter and prescription medicines only as told by your doctor.  This information is not intended to replace advice given to you by your health care provider. Make sure you discuss any questions you have with your health care provider. Document Released: 05/02/2008 Document Revised: 09/05/2015 Document Reviewed: 08/16/2015 Elsevier Interactive Patient Education  2017 [employer].

## 2017-03-18 DIAGNOSIS — J329 Chronic sinusitis, unspecified: Secondary | ICD-10-CM | POA: Diagnosis not present

## 2017-05-18 ENCOUNTER — Encounter: Payer: Self-pay | Admitting: Family Medicine

## 2017-05-18 ENCOUNTER — Ambulatory Visit: Payer: BLUE CROSS/BLUE SHIELD | Admitting: Family Medicine

## 2017-05-18 VITALS — BP 135/76 | HR 55 | Temp 98.3°F | Resp 16 | Ht 60.5 in | Wt 135.1 lb

## 2017-05-18 DIAGNOSIS — S93509A Unspecified sprain of unspecified toe(s), initial encounter: Secondary | ICD-10-CM | POA: Diagnosis not present

## 2017-05-18 DIAGNOSIS — J0100 Acute maxillary sinusitis, unspecified: Secondary | ICD-10-CM

## 2017-05-18 MED ORDER — CLINDAMYCIN HCL 300 MG PO CAPS: 300.0000 mg | ORAL_CAPSULE | Freq: Three times a day (TID) | ORAL | 0 refills | Status: DC

## 2017-05-18 NOTE — Progress Notes (Signed)
OFFICE VISIT  05/18/2017   CC:  Chief Complaint  Patient presents with  . Toe Pain    Left 4th digit   . URI     HPI:    Patient is a 47 y.o.  female who presents for toe pain as well as respiratory symptoms. Left 4th toe, stubbed toe on shoe on floor, toe twisted/wrenched in the process. Hurt right away, swelling a little while after, black and blue by evening. More swollen today.  Has been buddy taping it.   Took aleve.  Has pain in sinuses, runny nose, nasal congestion, two weeks of sx's.   HA, tired, no cough or ST.  No fever, no SOB or wheeze. Trying flonase, sudafed-no improvement.   Past Medical History:  Diagnosis Date  . Allergy   . Hyperlipidemia LDL goal <130   . IBS (irritable bowel syndrome)     Past Surgical History:  Procedure Laterality Date  . REDUCTION MAMMAPLASTY  1990    Outpatient Medications Prior to Visit  Medication Sig Dispense Refill  . cetirizine (ZYRTEC) 10 MG tablet Take 10 mg by mouth daily.    . DiphenhydrAMINE HCl (BENADRYL ALLERGY PO) Take 25 mg by mouth daily.    . fluticasone (FLONASE) 50 MCG/ACT nasal spray Place into the nose.    . levonorgestrel (MIRENA) 20 MCG/24HR IUD by Intrauterine route.    . naproxen sodium (ALEVE) 220 MG tablet Take 220 mg by mouth daily.    . Omega 3 1000 MG CAPS Take 1 capsule by mouth daily.    . pseudoephedrine (SUDAFED) 30 MG tablet Take 30 mg by mouth daily.    . TURMERIC PO Take 2 tablets by mouth daily.     No facility-administered medications prior to visit.     Allergies  Allergen Reactions  . Augmentin [Amoxicillin-Pot Clavulanate] Diarrhea    ROS As per HPI  PE: Blood pressure 135/76, pulse (!) 55, temperature 98.3 F (36.8 C), temperature source Oral, resp. rate 16, height 5' 0.5" (1.537 m), weight 135 lb 2 oz (61.3 kg), SpO2 100 %. VS: noted--normal. Gen: alert, NAD, NONTOXIC APPEARING. HEENT: eyes without injection, drainage, or swelling.  Ears: EACs clear, TMs with normal  light reflex and landmarks.  Nose: Clear rhinorrhea, with some dried, crusty exudate adherent to mildly injected mucosa.  No purulent d/c.  No paranasal sinus TTP.  No facial swelling.  Throat and mouth without focal lesion.  No pharyngial swelling, erythema, or exudate.   Neck: supple, no LAD.   LUNGS: CTA bilat, nonlabored resps.   CV: RRR, no m/r/g. EXT: no c/c/e.  Right 4th toe with mild soft tissue swelling and ecchymoses over DIP joint.  ROM of all toes intact. SKIN: no rash   LABS:    Chemistry      Component Value Date/Time   NA 141 07/12/2016   K 4.6 07/12/2016   BUN 16 07/12/2016   CREATININE 0.8 07/12/2016   GLU 100 07/12/2016      Component Value Date/Time   ALKPHOS 44 07/12/2016   AST 17 07/12/2016   ALT 15 07/12/2016      IMPRESSION AND PLAN:  1) Left 4th toe sprain: low suspicion of fracture. Rest--post op shoe fitted/dispensed in office today. Ice 20 min bid. Aleve 440mg  bid x 7d.   Buddy tape to 3rd or 5th toe.  2) Acute maxillary sinusitis: penicillin intolerance. Clindamycin 300 mg tid x 10d. OTC symptomatic care discussed.  An After Visit Summary was printed and  given to the patient.  FOLLOW UP: Return if symptoms worsen or fail to improve.  Signed:  Crissie Sickles, MD           05/18/2017

## 2017-06-13 DIAGNOSIS — Z23 Encounter for immunization: Secondary | ICD-10-CM | POA: Diagnosis not present

## 2017-08-22 DIAGNOSIS — N390 Urinary tract infection, site not specified: Secondary | ICD-10-CM | POA: Diagnosis not present

## 2018-01-26 DIAGNOSIS — Z23 Encounter for immunization: Secondary | ICD-10-CM | POA: Diagnosis not present

## 2018-07-30 ENCOUNTER — Ambulatory Visit (INDEPENDENT_AMBULATORY_CARE_PROVIDER_SITE_OTHER): Payer: BC Managed Care – PPO | Admitting: Family Medicine

## 2018-07-30 ENCOUNTER — Encounter: Payer: Self-pay | Admitting: Family Medicine

## 2018-07-30 ENCOUNTER — Other Ambulatory Visit: Payer: BC Managed Care – PPO

## 2018-07-30 ENCOUNTER — Telehealth: Payer: Self-pay | Admitting: General Practice

## 2018-07-30 ENCOUNTER — Other Ambulatory Visit: Payer: Self-pay

## 2018-07-30 VITALS — Temp 98.1°F | Ht 60.5 in | Wt 130.0 lb

## 2018-07-30 DIAGNOSIS — F418 Other specified anxiety disorders: Secondary | ICD-10-CM

## 2018-07-30 DIAGNOSIS — Z7189 Other specified counseling: Secondary | ICD-10-CM | POA: Insufficient documentation

## 2018-07-30 DIAGNOSIS — R0981 Nasal congestion: Secondary | ICD-10-CM

## 2018-07-30 DIAGNOSIS — Z20822 Contact with and (suspected) exposure to covid-19: Secondary | ICD-10-CM

## 2018-07-30 DIAGNOSIS — R05 Cough: Secondary | ICD-10-CM

## 2018-07-30 DIAGNOSIS — R6889 Other general symptoms and signs: Secondary | ICD-10-CM | POA: Diagnosis not present

## 2018-07-30 DIAGNOSIS — R5383 Other fatigue: Secondary | ICD-10-CM | POA: Diagnosis not present

## 2018-07-30 DIAGNOSIS — R059 Cough, unspecified: Secondary | ICD-10-CM

## 2018-07-30 DIAGNOSIS — J320 Chronic maxillary sinusitis: Secondary | ICD-10-CM | POA: Insufficient documentation

## 2018-07-30 DIAGNOSIS — B349 Viral infection, unspecified: Secondary | ICD-10-CM | POA: Insufficient documentation

## 2018-07-30 MED ORDER — BENZONATATE 200 MG PO CAPS: 200.0000 mg | ORAL_CAPSULE | Freq: Two times a day (BID) | ORAL | 0 refills | Status: DC | PRN

## 2018-07-30 MED ORDER — PROPRANOLOL HCL 10 MG PO TABS: ORAL_TABLET | ORAL | 2 refills | Status: DC

## 2018-07-30 NOTE — Progress Notes (Signed)
VIRTUAL VISIT VIA VIDEO  I connected with Shelly Hernandez on 07/30/18 at  8:00 AM EDT by a video enabled telemedicine application and verified that I am speaking with the correct person using two identifiers. Location patient: Home Location provider: [facility], Office Persons participating in the virtual visit: Patient, Dr. Raoul Pitch and R.Baker, LPN  I discussed the limitations of evaluation and management by telemedicine and the availability of in person appointments. The patient expressed understanding and agreed to proceed.   SUBJECTIVE Chief Complaint  Patient presents with  . Cough    x10 days. Dry cough. Denies SOB and fever. Chest does feel heavy.   . Sore Throat  . Chills  . Facial Pain    HPI: Shelly Hernandez is a 48 y.o. female present for acute concern. Cough:  Patient reports she started with a dry cough approximately 10 days ago when she was [place] the beach [place].  She endorses a "heavy chest "feeling but not short of breath.  Past week her cough has increased, she has a sore throat, glands are swollen, stuffy nose and the "heavy chest "feeling has returned.  She endorses feeling feverish, but states that she has never had a elevated temperature per her thermometer.  She denies chills or GI symptoms.  She denies rash.  She denies myalgias.  She has not routinely been taking her Zyrtec or Flonase.  She has been using Sudafed as needed.  She has no known exposures COVID or other illness.  Anxiety: Patient reports anxiety surrounding speaking/performance.  She states she gets very nervous and she is required to do more speaking in front of large groups of people now.  She would like to try medication to help ease this for her.  ROS: See pertinent positives and negatives per HPI.  Patient Active Problem List   Diagnosis Date Noted  . Overweight (BMI 25.0-29.9) 08/04/2016  . Hyperlipidemia LDL goal <130     Social History   Tobacco Use  . Smoking status:  Never Smoker  . Smokeless tobacco: Never Used  Substance Use Topics  . Alcohol use: Yes    Alcohol/week: 6.0 standard drinks    Types: 4 Cans of beer, 2 Shots of liquor per week    Current Outpatient Medications:  .  DiphenhydrAMINE HCl (BENADRYL ALLERGY PO), Take 25 mg by mouth daily., Disp: , Rfl:  .  fluticasone (FLONASE) 50 MCG/ACT nasal spray, Place into the nose., Disp: , Rfl:  .  levonorgestrel (MIRENA) 20 MCG/24HR IUD, by Intrauterine route., Disp: , Rfl:  .  Omega 3 1000 MG CAPS, Take 1 capsule by mouth daily., Disp: , Rfl:  .  pseudoephedrine (SUDAFED) 30 MG tablet, Take 30 mg by mouth daily., Disp: , Rfl:  .  TURMERIC PO, Take 2 tablets by mouth daily., Disp: , Rfl:  .  cetirizine (ZYRTEC) 10 MG tablet, Take 10 mg by mouth daily., Disp: , Rfl:   Allergies  Allergen Reactions  . Augmentin [Amoxicillin-Pot Clavulanate] Diarrhea    OBJECTIVE: Temp 98.1 F (36.7 C) (Oral)   Ht 5' 0.5" (1.537 m)   Wt 130 lb (59 kg)   BMI 24.97 kg/m  Gen: No acute distress. Nontoxic in appearance.  Well-developed, well-nourished, pleasant Caucasian female. HENT: AT. [place].  MMM.  Eyes:Pupils Equal Round Reactive to light, Extraocular movements intact,  Conjunctiva without redness, discharge or icterus. CV: no edema Chest: Cough present on exam. No shortness of breath. Skin: No rashes, purpura or  petechiae.  Neuro:  Normal gait. Alert. Oriented x3  Psych: Normal affect, dress and demeanor. Normal speech. Normal thought content and judgment.  ASSESSMENT AND PLAN: Shelly Hernandez is a 48 y.o. female present for  Cough/Chronic maxillary sinusitis/Nasal congestion/fatigue/COVID education -Patient was educated on COVID-19 prevention, treatment and quarantine. -She will be tested for COVID-19, they will call her to schedule. -In the meantime rest, hydrate, OTC Mucinex DM, Tessalon Perles prescribed. -Restart daily Zyrtec and Flonase use. -COVID-19 is negative and symptoms persist will have  patient schedule a virtual visit in we will treat.   Performance anxiety -Discussed treatment options with her today and she is agreeable to propranolol approximately 30 minutes before performance. Propranolol 10 mg daily PRN prescribed.     > 25 minutes spent with patient, >50% of time spent face to face     Howard Pouch, DO 07/30/2018

## 2018-07-30 NOTE — Patient Instructions (Signed)

## 2018-07-30 NOTE — Addendum Note (Signed)
Addended by: Denman George on: 07/30/2018 09:16 AM   Modules accepted: Orders

## 2018-07-30 NOTE — Telephone Encounter (Signed)
Pt has been scheduled for Covid-19 testing.   Scheduled with pt directly.  Pt was referred by: Howard Pouch DO

## 2018-07-30 NOTE — Telephone Encounter (Signed)
-----   Message from Caroll Rancher, LPN sent at 6/72/0919  8:37 AM EDT ----- Regarding: CKICH-[id number] test Shelly Hernandez  MRN [id number] DOB [birth year] [employer]# [id number]  Dr Howard Pouch

## 2018-08-01 ENCOUNTER — Encounter: Payer: Self-pay | Admitting: Family Medicine

## 2018-08-01 LAB — NOVEL CORONAVIRUS, NAA: SARS-CoV-2, NAA: NOT DETECTED

## 2018-08-02 ENCOUNTER — Telehealth: Payer: Self-pay | Admitting: Family Medicine

## 2018-08-02 NOTE — Telephone Encounter (Signed)
Contact patient regarding appt. Verify DOB. Scheduled appt 6/16 @ 3:30pm with Dr. Raoul Pitch

## 2018-08-02 NOTE — Telephone Encounter (Signed)
echart message: Message  ----- Message from Caroll Rancher, LPN sent at 10/20/7953 8:33 AM EDT -----      ----- Message from Dery, Stephani Police to Ma Hillock, DO sent at 08/01/2018 6:59 PM -----   I received my COVID-19 test results today and they were negative (thank goodness). I am still feeling bad with sinus issues, pain in my face and ears. Can you please call me in an antibiotic to [facility]?      Per office note:  "OPRAF-42 is negative and symptoms persist will have patient schedule a virtual visit in we will treat."  Pleas schedule her.

## 2018-08-02 NOTE — Telephone Encounter (Signed)
Pts wrote my chart message stating COVID-19 test is neg and she would like abx called in as she is still feeling bad. Appt was 07/30/2018.   Please advise

## 2018-08-02 NOTE — Telephone Encounter (Signed)
Please call and schedule patient

## 2018-08-03 ENCOUNTER — Other Ambulatory Visit: Payer: Self-pay

## 2018-08-03 ENCOUNTER — Encounter: Payer: Self-pay | Admitting: Family Medicine

## 2018-08-03 ENCOUNTER — Ambulatory Visit: Payer: BC Managed Care – PPO | Admitting: Family Medicine

## 2018-08-03 VITALS — BP 117/82 | HR 70 | Temp 97.7°F | Resp 17 | Ht 61.0 in | Wt 134.0 lb

## 2018-08-03 DIAGNOSIS — J320 Chronic maxillary sinusitis: Secondary | ICD-10-CM

## 2018-08-03 DIAGNOSIS — R05 Cough: Secondary | ICD-10-CM | POA: Diagnosis not present

## 2018-08-03 DIAGNOSIS — R059 Cough, unspecified: Secondary | ICD-10-CM

## 2018-08-03 MED ORDER — METHYLPREDNISOLONE ACETATE 80 MG/ML IJ SUSP
80.0000 mg | Freq: Once | INTRAMUSCULAR | Status: AC
  Administered 2018-08-03: 80 mg via INTRAMUSCULAR

## 2018-08-03 MED ORDER — BENZONATATE 200 MG PO CAPS
200.0000 mg | ORAL_CAPSULE | Freq: Two times a day (BID) | ORAL | 0 refills | Status: DC | PRN
Start: 2018-08-03 — End: 2020-04-23

## 2018-08-03 MED ORDER — DOXYCYCLINE HYCLATE 100 MG PO TABS: 100.0000 mg | ORAL_TABLET | Freq: Two times a day (BID) | ORAL | 0 refills | Status: DC

## 2018-08-03 NOTE — Progress Notes (Signed)
Shelly Hernandez , 11/20/1970, 48 y.o., female MRN: [id number] Patient Care Team    Relationship Specialty Notifications Start End  Shelly Hillock, DO PCP - General Family Medicine  08/04/16   Brien Few, MD Consulting Physician Obstetrics and Gynecology  08/04/16   Melida Quitter, MD Consulting Physician Otolaryngology  02/03/17     Chief Complaint  Patient presents with  . Facial Pain    pain under both eyes, ears, and lymph nodes swollen   . Cough    Constant dry cough      Subjective: Pt presents for an OV with complaints of worsening  of cough, sinus pressure, ear pain, swollen glands and dizziness for 2 weeks duration. COVID19 testing was completed and was negative. Pt has been taking the mucinex DM, tessalon perles, zyrtec and Flonase daily to ease their symptoms. Her symptoms persist despite treatment and her facial pressure and pain are worsening.  Depression screen [facility] 2/9 08/04/2016  Decreased Interest 0  Down, Depressed, Hopeless 0  PHQ - 2 Score 0    Allergies  Allergen Reactions  . Augmentin [Amoxicillin-Pot Clavulanate] Diarrhea   Social History   Social History Narrative   Married to Shelly Hernandez. 1 son Shelly Hernandez.    B.A. Degree, works as a Customer service manager.    Drinks caffeine. Daily vitamin use.   Wears her seatbelt, bicycle helmet, smoke detector in the home. Firearms locked in the home.    Feels safe in her relationships.    Past Medical History:  Diagnosis Date  . Allergy   . Hyperlipidemia LDL goal <130   . IBS (irritable bowel syndrome)    Past Surgical History:  Procedure Laterality Date  . REDUCTION MAMMAPLASTY  1990   Family History  Problem Relation Age of Onset  . Diabetes Mother   . Arthritis Mother   . Diabetes Father   . Hearing loss Father   . Heart disease Father   . Bipolar disorder Son   . Diabetes Maternal Aunt   . Arthritis Maternal Aunt   . Heart disease Maternal Uncle   . Diabetes Paternal Aunt   . Stroke Maternal Grandmother   .  Arthritis Maternal Grandmother   . Esophageal cancer Maternal Grandfather   . COPD Maternal Grandfather   . Diabetes Paternal Grandmother   . Heart disease Paternal Grandfather    Allergies as of 08/03/2018      Reactions   Augmentin [amoxicillin-pot Clavulanate] Diarrhea      Medication List       Accurate as of August 03, 2018  3:37 PM. If you have any questions, ask your nurse or doctor.        BENADRYL ALLERGY PO Take 25 mg by mouth daily.   benzonatate 200 MG capsule Commonly known as: TESSALON Take 1 capsule (200 mg total) by mouth 2 (two) times daily as needed for cough.   cetirizine 10 MG tablet Commonly known as: ZYRTEC Take 10 mg by mouth daily.   dextromethorphan-guaiFENesin 30-600 MG 12hr tablet Commonly known as: MUCINEX DM Take 1 tablet by mouth 2 (two) times daily.   fluticasone 50 MCG/ACT nasal spray Commonly known as: FLONASE Place into the nose.   levonorgestrel 20 MCG/24HR IUD Commonly known as: MIRENA by Intrauterine route.   Omega 3 1000 MG Caps Take 1 capsule by mouth daily.   propranolol 10 MG tablet Commonly known as: INDERAL 1 tab 30 min prior to speaking PRN   pseudoephedrine 30 MG tablet Commonly known as: SUDAFED  Take 30 mg by mouth daily.   TURMERIC PO Take 2 tablets by mouth daily.       All past medical history, surgical history, allergies, family history, immunizations andmedications were updated in the EMR today and reviewed under the history and medication portions of their EMR.     ROS: Negative, with the exception of above mentioned in HPI   Objective:  BP 117/82 (BP Location: Right Arm, Patient Position: Sitting, Cuff Size: Normal)   Pulse 70   Temp 97.7 F (36.5 C) (Temporal)   Resp 17   Ht 5\' 1"  (1.549 m)   Wt 134 lb (60.8 kg)   SpO2 99%   BMI 25.32 kg/m  Body mass index is 25.32 kg/m. Gen: Afebrile. No acute distress. Nontoxic in appearance, well developed, well nourished.  HENT: AT. [place]. Bilateral TM  visualized with mild erythema and fluid left ear. MMM, no oral lesions. Bilateral nares with erythema and drainage. Throat without erythema or exudates. PND present. Cough present.  Eyes:Pupils Equal Round Reactive to light, Extraocular movements intact,  Conjunctiva without redness, discharge or icterus. Neck/lymp/endocrine: Supple,bilateral cervical lymphadenopthy lymphadenopathy CV: RRR no murmur Chest: CTAB, no wheeze or crackles. Good air movement, normal resp effort.  Abd: Soft. NTND. BS present. no Masses palpated. No rebound or guarding.  Skin: no rashes, purpura or petechiae.  Neuro: Normal gait. PERLA. EOMi. Alert. Oriented x3   No exam data present No results found. No results found for this or any previous visit (from the past 24 hour(s)).  Assessment/Plan: [name] is a 48 y.o. female present for OV for  Chronic maxillary sinusitis Rest, hydrate.  Continue  flonase, mucinex (DM if cough), zyrtec. Prescribed doxycyline and tessalon Perles  prescribed, take until completed.  If cough present it can last up to 6-8 weeks.  F/U 2 weeks of not improved.    Reviewed expectations re: course of current medical issues.  Discussed self-management of symptoms.  Outlined signs and symptoms indicating need for more acute intervention.  Patient verbalized understanding and all questions were answered.  Patient received an After-Visit Summary.    No orders of the defined types were placed in this encounter.    Note is dictated utilizing voice recognition software. Although note has been proof read prior to signing, occasional typographical errors still can be missed. If any questions arise, please do not hesitate to call for verification.   electronically signed by:  Howard Pouch, DO  [place]

## 2018-08-03 NOTE — Patient Instructions (Signed)
Rest, hydrate.  Continue  flonase, mucinex (DM if cough), zyrtec. Prescribed doxycyline and tessalon perles  prescribed, take until completed.  If cough present it can last up to 6-8 weeks.  F/U 2 weeks of not improved.    Sinusitis, Adult Sinusitis is soreness and swelling (inflammation) of your sinuses. Sinuses are hollow spaces in the bones around your face. They are located:  Around your eyes.  In the middle of your forehead.  Behind your nose.  In your cheekbones. Your sinuses and nasal passages are lined with a fluid called mucus. Mucus drains out of your sinuses. Swelling can trap mucus in your sinuses. This lets germs (bacteria, virus, or fungus) grow, which leads to infection. Most of the time, this condition is caused by a virus. What are the causes? This condition is caused by:  Allergies.  Asthma.  Germs.  Things that block your nose or sinuses.  Growths in the nose (nasal polyps).  Chemicals or irritants in the air.  Fungus (rare). What increases the risk? You are more likely to develop this condition if:  You have a weak body defense system (immune system).  You do a lot of swimming or diving.  You use nasal sprays too much.  You smoke. What are the signs or symptoms? The main symptoms of this condition are pain and a feeling of pressure around the sinuses. Other symptoms include:  Stuffy nose (congestion).  Runny nose (drainage).  Swelling and warmth in the sinuses.  Headache.  Toothache.  A cough that may get worse at night.  Mucus that collects in the throat or the back of the nose (postnasal drip).  Being unable to smell and taste.  Being very tired (fatigue).  A fever.  Sore throat.  Bad breath. How is this diagnosed? This condition is diagnosed based on:  Your symptoms.  Your medical history.  A physical exam.  Tests to find out if your condition is short-term (acute) or long-term (chronic). Your doctor may: ? Check  your nose for growths (polyps). ? Check your sinuses using a tool that has a light (endoscope). ? Check for allergies or germs. ? Do imaging tests, such as an MRI or CT scan. How is this treated? Treatment for this condition depends on the cause and whether it is short-term or long-term.  If caused by a virus, your symptoms should go away on their own within 10 days. You may be given medicines to relieve symptoms. They include: ? Medicines that shrink swollen tissue in the nose. ? Medicines that treat allergies (antihistamines). ? A spray that treats swelling of the nostrils. ? Rinses that help get rid of thick mucus in your nose (nasal saline washes).  If caused by bacteria, your doctor may wait to see if you will get better without treatment. You may be given antibiotic medicine if you have: ? A very bad infection. ? A weak body defense system.  If caused by growths in the nose, you may need to have surgery. Follow these instructions at home: Medicines  Take, use, or apply over-the-counter and prescription medicines only as told by your doctor. These may include nasal sprays.  If you were prescribed an antibiotic medicine, take it as told by your doctor. Do not stop taking the antibiotic even if you start to feel better. Hydrate and humidify   Drink enough water to keep your pee (urine) pale yellow.  Use a cool mist humidifier to keep the humidity level in your home above  50%.  Breathe in steam for 10-15 minutes, 3-4 times a day, or as told by your doctor. You can do this in the bathroom while a hot shower is running.  Try not to spend time in cool or dry air. Rest  Rest as much as you can.  Sleep with your head raised (elevated).  Make sure you get enough sleep each night. General instructions   Put a warm, moist washcloth on your face 3-4 times a day, or as often as told by your doctor. This will help with discomfort.  Wash your hands often with soap and water. If  there is no soap and water, use hand sanitizer.  Do not smoke. Avoid being around people who are smoking (secondhand smoke).  Keep all follow-up visits as told by your doctor. This is important. Contact a doctor if:  You have a fever.  Your symptoms get worse.  Your symptoms do not get better within 10 days. Get help right away if:  You have a very bad headache.  You cannot stop throwing up (vomiting).  You have very bad pain or swelling around your face or eyes.  You have trouble seeing.  You feel confused.  Your neck is stiff.  You have trouble breathing. Summary  Sinusitis is swelling of your sinuses. Sinuses are hollow spaces in the bones around your face.  This condition is caused by tissues in your nose that become inflamed or swollen. This traps germs. These can lead to infection.  If you were prescribed an antibiotic medicine, take it as told by your doctor. Do not stop taking it even if you start to feel better.  Keep all follow-up visits as told by your doctor. This is important. This information is not intended to replace advice given to you by your health care provider. Make sure you discuss any questions you have with your health care provider. Document Released: 07/23/2007 Document Revised: 07/06/2017 Document Reviewed: 07/06/2017 Elsevier Interactive Patient Education  2019 [employer].

## 2018-09-09 DIAGNOSIS — Z124 Encounter for screening for malignant neoplasm of cervix: Secondary | ICD-10-CM | POA: Diagnosis not present

## 2018-09-09 DIAGNOSIS — N951 Menopausal and female climacteric states: Secondary | ICD-10-CM | POA: Diagnosis not present

## 2018-09-09 DIAGNOSIS — Z1231 Encounter for screening mammogram for malignant neoplasm of breast: Secondary | ICD-10-CM | POA: Diagnosis not present

## 2018-09-09 DIAGNOSIS — Z01419 Encounter for gynecological examination (general) (routine) without abnormal findings: Secondary | ICD-10-CM | POA: Diagnosis not present

## 2018-09-09 DIAGNOSIS — Z6825 Body mass index (BMI) 25.0-25.9, adult: Secondary | ICD-10-CM | POA: Diagnosis not present

## 2018-09-09 DIAGNOSIS — Z30433 Encounter for removal and reinsertion of intrauterine contraceptive device: Secondary | ICD-10-CM | POA: Diagnosis not present

## 2018-11-25 ENCOUNTER — Other Ambulatory Visit: Payer: Self-pay

## 2018-11-25 DIAGNOSIS — Z20828 Contact with and (suspected) exposure to other viral communicable diseases: Secondary | ICD-10-CM | POA: Diagnosis not present

## 2018-11-25 DIAGNOSIS — Z20822 Contact with and (suspected) exposure to covid-19: Secondary | ICD-10-CM

## 2018-11-26 LAB — NOVEL CORONAVIRUS, NAA: SARS-CoV-2, NAA: NOT DETECTED

## 2019-01-09 DIAGNOSIS — M549 Dorsalgia, unspecified: Secondary | ICD-10-CM | POA: Diagnosis not present

## 2019-01-09 DIAGNOSIS — M545 Low back pain: Secondary | ICD-10-CM | POA: Diagnosis not present

## 2019-01-20 ENCOUNTER — Encounter (HOSPITAL_BASED_OUTPATIENT_CLINIC_OR_DEPARTMENT_OTHER): Payer: Self-pay | Admitting: *Deleted

## 2019-01-20 ENCOUNTER — Other Ambulatory Visit: Payer: Self-pay

## 2019-01-20 ENCOUNTER — Emergency Department (HOSPITAL_BASED_OUTPATIENT_CLINIC_OR_DEPARTMENT_OTHER): Payer: BC Managed Care – PPO

## 2019-01-20 ENCOUNTER — Emergency Department (HOSPITAL_BASED_OUTPATIENT_CLINIC_OR_DEPARTMENT_OTHER)
Admission: EM | Admit: 2019-01-20 | Discharge: 2019-01-20 | Disposition: A | Discharge status: Home / Self Care | Payer: BC Managed Care – PPO | Attending: Emergency Medicine | Admitting: Emergency Medicine

## 2019-01-20 DIAGNOSIS — R0789 Other chest pain: Secondary | ICD-10-CM | POA: Insufficient documentation

## 2019-01-20 DIAGNOSIS — Z79899 Other long term (current) drug therapy: Secondary | ICD-10-CM | POA: Insufficient documentation

## 2019-01-20 DIAGNOSIS — E785 Hyperlipidemia, unspecified: Secondary | ICD-10-CM | POA: Insufficient documentation

## 2019-01-20 DIAGNOSIS — Z880 Allergy status to penicillin: Secondary | ICD-10-CM | POA: Diagnosis not present

## 2019-01-20 DIAGNOSIS — R079 Chest pain, unspecified: Secondary | ICD-10-CM | POA: Diagnosis not present

## 2019-01-20 LAB — COMPREHENSIVE METABOLIC PANEL
ALT: 16 U/L (ref 0–44)
AST: 17 U/L (ref 15–41)
Albumin: 4.6 g/dL (ref 3.5–5.0)
Alkaline Phosphatase: 55 U/L (ref 38–126)
Anion gap: 8 (ref 5–15)
BUN: 19 mg/dL (ref 6–20)
CO2: 26 mmol/L (ref 22–32)
Calcium: 9.3 mg/dL (ref 8.9–10.3)
Chloride: 104 mmol/L (ref 98–111)
Creatinine, Ser: 0.62 mg/dL (ref 0.44–1.00)
GFR calc Af Amer: >60 mL/min (ref >60)
GFR calc non Af Amer: >60 mL/min (ref >60)
Glucose, Bld: 101 mg/dL — ABNORMAL HIGH (ref 70–99)
Potassium: 3.5 mmol/L (ref 3.5–5.1)
Sodium: 138 mmol/L (ref 135–145)
Total Bilirubin: 0.4 mg/dL (ref 0.3–1.2)
Total Protein: 7.8 g/dL (ref 6.5–8.1)

## 2019-01-20 LAB — TROPONIN I (HIGH SENSITIVITY)
Troponin I (High Sensitivity): <2 ng/L (ref <18)
Troponin I (High Sensitivity): <2 ng/L (ref <18)

## 2019-01-20 LAB — CBC WITH DIFFERENTIAL/PLATELET
Abs Immature Granulocytes: 0.03 K/uL (ref 0.00–0.07)
Basophils Absolute: 0 K/uL (ref 0.0–0.1)
Basophils Relative: 0 %
Eosinophils Absolute: 0.4 K/uL (ref 0.0–0.5)
Eosinophils Relative: 4 %
HCT: 41.2 % (ref 36.0–46.0)
Hemoglobin: 13.1 g/dL (ref 12.0–15.0)
Immature Granulocytes: 0 %
Lymphocytes Relative: 33 %
Lymphs Abs: 2.8 K/uL (ref 0.7–4.0)
MCH: 29.3 pg (ref 26.0–34.0)
MCHC: 31.8 g/dL (ref 30.0–36.0)
MCV: 92.2 fL (ref 80.0–100.0)
Monocytes Absolute: 0.8 K/uL (ref 0.1–1.0)
Monocytes Relative: 9 %
Neutro Abs: 4.5 K/uL (ref 1.7–7.7)
Neutrophils Relative %: 54 %
Platelets: 325 K/uL (ref 150–400)
RBC: 4.47 MIL/uL (ref 3.87–5.11)
RDW: 11.5 % (ref 11.5–15.5)
WBC: 8.5 K/uL (ref 4.0–10.5)
nRBC: 0 % (ref 0.0–0.2)

## 2019-01-20 LAB — D-DIMER, QUANTITATIVE: D-Dimer, Quant: <0.27 ug/mL-FEU (ref 0.00–0.50)

## 2019-01-20 MED ORDER — IBUPROFEN 800 MG PO TABS
800.0000 mg | ORAL_TABLET | Freq: Once | ORAL | Status: AC
  Administered 2019-01-20: 800 mg via ORAL
  Filled 2019-01-20: qty 1

## 2019-01-20 MED ORDER — IBUPROFEN 600 MG PO TABS: 600.0000 mg | ORAL_TABLET | Freq: Four times a day (QID) | ORAL | 0 refills | Status: DC | PRN

## 2019-01-20 NOTE — ED Notes (Signed)
Shaun Primary RN said MD did not want 2nd troponin

## 2019-01-20 NOTE — Discharge Instructions (Signed)
There Is no evidence of heart attack or blood clot in the lung.  As we discussed, you are low risk for heart disease but not 0 risk.  You should follow-up with your doctor for a stress test.  Take the anti-inflammatory as prescribed for pain is likely coming from your chest wall.  Return to the ED if your chest pain becomes exertional, associated with shortness of breath, nausea, vomiting, sweating or other concerns.

## 2019-01-20 NOTE — ED Provider Notes (Signed)
[place] EMERGENCY DEPARTMENT Provider Note   CSN: [id number] Arrival date & time: 01/20/19  1736     History   Chief Complaint Chief Complaint  Patient presents with  . Chest Pain    HPI Shelly Hernandez is a 48 y.o. female.     Patient here with intermittent chest pain for the past 1 week.  States she is under a lot of stress because she is moving and has been moving boxes.  She develops some left-sided chest pain near the site of her breast and low sternum that has been intermittent for the past several weeks.  It is somewhat improved with Tums.  She says the last 2 days have been especially worse and she is had ongoing pain to the left side of her chest for since approximately 3 PM.  She did take a beta-blocker which she takes as needed for anxiety with some relief.  There is no shortness of breath, nausea, fever, vomiting.  She did have some episodes of diaphoresis and a tight pressure in her chest.  She has not noticed anything that is exertional or pleuritic.  Nothing makes it better or worse.  She has never had any cardiac issues never had a stress test.  Only risk factor is hyperlipidemia.  He does not smoke.  No family history of early MI. She had elevated blood pressure on arrival but denies taking any blood pressure medication.  She is concerned that this chest pain could be due to anxiety.  The history is provided by the patient.  Chest Pain Associated symptoms: no abdominal pain, no dizziness, no fever, no headache, no shortness of breath, no vomiting and no weakness     Past Medical History:  Diagnosis Date  . Allergy   . Hyperlipidemia LDL goal <130   . IBS (irritable bowel syndrome)     Patient Active Problem List   Diagnosis Date Noted  . Cough 07/30/2018  . Performance anxiety 07/30/2018  . Chronic maxillary sinusitis 07/30/2018  . Educated about COVID-19 virus infection 07/30/2018  . Overweight (BMI 25.0-29.9) 08/04/2016  . Hyperlipidemia LDL  goal <130     Past Surgical History:  Procedure Laterality Date  . REDUCTION MAMMAPLASTY  1990     OB History    Gravida  1   Para  1   Term      Preterm      AB      Living  1     SAB      TAB      Ectopic      Multiple      Live Births               Home Medications    Prior to Admission medications   Medication Sig Start Date End Date Taking? Authorizing Provider  benzonatate (TESSALON) 200 MG capsule Take 1 capsule (200 mg total) by mouth 2 (two) times daily as needed for cough. 08/03/18   Kuneff, Renee A, DO  cetirizine (ZYRTEC) 10 MG tablet Take 10 mg by mouth daily.    [provider]  dextromethorphan-guaiFENesin (MUCINEX DM) 30-600 MG 12hr tablet Take 1 tablet by mouth 2 (two) times daily.    [provider]  DiphenhydrAMINE HCl (BENADRYL ALLERGY PO) Take 25 mg by mouth daily.    [provider]  doxycycline (VIBRA-TABS) 100 MG tablet Take 1 tablet (100 mg total) by mouth 2 (two) times daily. 08/03/18   Ma Hillock,  DO  fluticasone (FLONASE) 50 MCG/ACT nasal spray Place into the nose.    [provider]  levonorgestrel (MIRENA) 20 MCG/24HR IUD by Intrauterine route.    [provider]  Omega 3 1000 MG CAPS Take 1 capsule by mouth daily.    [provider]  propranolol (INDERAL) 10 MG tablet 1 tab 30 min prior to speaking PRN 07/30/18   Kuneff, Renee A, DO  pseudoephedrine (SUDAFED) 30 MG tablet Take 30 mg by mouth daily.    [provider]  TURMERIC PO Take 2 tablets by mouth daily.    [provider]    Family History Family History  Problem Relation Age of Onset  . Diabetes Mother   . Arthritis Mother   . Diabetes Father   . Hearing loss Father   . Heart disease Father   . Bipolar disorder Son   . Diabetes Maternal Aunt   . Arthritis Maternal Aunt   . Heart disease Maternal Uncle   . Diabetes Paternal Aunt   . Stroke Maternal Grandmother   . Arthritis Maternal  Grandmother   . Esophageal cancer Maternal Grandfather   . COPD Maternal Grandfather   . Diabetes Paternal Grandmother   . Heart disease Paternal Grandfather     Social History Social History   Tobacco Use  . Smoking status: Never Smoker  . Smokeless tobacco: Never Used  Substance Use Topics  . Alcohol use: Yes    Alcohol/week: 6.0 standard drinks    Types: 4 Cans of beer, 2 Shots of liquor per week  . Drug use: No     Allergies   Augmentin [amoxicillin-pot clavulanate]   Review of Systems Review of Systems  Constitutional: Negative for activity change, appetite change and fever.  HENT: Negative for congestion and rhinorrhea.   Respiratory: Positive for chest tightness. Negative for shortness of breath.   Cardiovascular: Positive for chest pain.  Gastrointestinal: Negative for abdominal pain and vomiting.  Genitourinary: Negative for dysuria and hematuria.  Musculoskeletal: Negative for arthralgias and myalgias.  Skin: Negative for rash.  Neurological: Negative for dizziness, weakness, light-headedness and headaches.   all other systems are negative except as noted in the HPI and PMH.     Physical Exam Updated Vital Signs BP (!) 177/91   Pulse 74   Ht 5' (1.524 m)   Wt 59.9 kg   SpO2 100%   BMI 25.78 kg/m   Physical Exam Vitals signs and nursing note reviewed.  Constitutional:      General: She is not in acute distress.    Appearance: She is well-developed.     Comments: Anxious appearing  HENT:     Head: Normocephalic and atraumatic.     Mouth/Throat:     Pharynx: No oropharyngeal exudate.  Eyes:     Conjunctiva/sclera: Conjunctivae normal.     Pupils: Pupils are equal, round, and reactive to light.  Neck:     Musculoskeletal: Normal range of motion and neck supple.     Comments: No meningismus. Cardiovascular:     Rate and Rhythm: Normal rate and regular rhythm.     Heart sounds: Normal heart sounds. No murmur.  Pulmonary:     Effort: Pulmonary  effort is normal. No respiratory distress.     Breath sounds: Normal breath sounds.     Comments: Chaperone present.  No rashes.  There is reproducible left-sided chest tenderness along her sternum And left lower ribs. Chest:     Chest wall: Tenderness present.  Abdominal:     Palpations: Abdomen is soft.     Tenderness: There is no abdominal tenderness. There is no guarding or rebound.  Musculoskeletal: Normal range of motion.        General: No tenderness.  Skin:    General: Skin is warm.     Capillary Refill: Capillary refill takes less than 2 seconds.  Neurological:     General: No focal deficit present.     Mental Status: She is alert and oriented to person, place, and time. Mental status is at baseline.     Cranial Nerves: No cranial nerve deficit.     Motor: No abnormal muscle tone.     Coordination: Coordination normal.     Comments:  5/5 strength throughout. CN 2-12 intact.Equal grip strength.   Psychiatric:        Behavior: Behavior normal.      ED Treatments / Results  Labs (all labs ordered are listed, but only abnormal results are displayed) Labs Reviewed  COMPREHENSIVE METABOLIC PANEL - Abnormal; Notable for the following components:      Result Value   Glucose, Bld 101 (*)    All other components within normal limits  CBC WITH DIFFERENTIAL/PLATELET  D-DIMER, QUANTITATIVE (NOT AT [facility])  TROPONIN I (HIGH SENSITIVITY)  TROPONIN I (HIGH SENSITIVITY)  TROPONIN I (HIGH SENSITIVITY)    EKG EKG Interpretation  Date/Time:  Thursday January 20 2019 17:41:50 EST Ventricular Rate:  64 PR Interval:  140 QRS Duration: 76 QT Interval:  392 QTC Calculation: 404 R Axis:   12 Text Interpretation: Normal sinus rhythm Normal ECG No previous ECGs available Confirmed by Ezequiel Essex [phone]) on 01/20/2019 7:35:13 PM   Radiology Dg Chest 2 View  Result Date: 01/20/2019 CLINICAL DATA:  Chest pain. EXAM: CHEST - 2 VIEW COMPARISON:  None. FINDINGS: The heart size and  mediastinal contours are within normal limits. Both lungs are clear. No pneumothorax or pleural effusion is noted. The visualized skeletal structures are unremarkable. IMPRESSION: No active cardiopulmonary disease. Electronically Signed   By: Marijo Conception M.D.   On: 01/20/2019 18:00    Procedures Procedures (including critical care time)  Medications Ordered in ED Medications  ibuprofen (ADVIL) tablet 800 mg (has no administration in time range)     Initial Impression / Assessment and Plan / ED Course  I have reviewed the triage vital signs and the nursing notes.  Pertinent labs & imaging results that were available during my care of the patient were reviewed by me and considered in my medical decision making (see chart for details).       Intermittent chest pain for the past 1 week with increased stress at home.  Admits to ongoing stress of moving.  Her EKG is nonischemic.  Initial troponin is negative.  Pain is not exertional or pleuritic but there is some component of diaphoresis by report.  The pain is reproducible to palpation.  Troponin negative.  Discussed with patient she is low risk for ACS but nonzero risk.  Would recommend she have a stress test with her doctor.  Heart score is 2  Troponin Negative x2.  D-dimer negative.  Low suspicion for ACS or PE.  Blood pressure has spontaneously improved to 140/82.  We will treat supportively for likely musculoskeletal chest pain.  Discussed with patient need for follow-up with PCP for stress test.  Return to the ED if chest pain becomes exertional, associated shortness of breath, nausea, vomiting, diaphoresis or other concerns.  Final  Clinical Impressions(s) / ED Diagnoses   Final diagnoses:  Atypical chest pain    ED Discharge Orders    None       Guenevere Roorda, Annie Main, MD 01/20/19 [id number]

## 2019-01-20 NOTE — ED Triage Notes (Signed)
Pt c/o mid sternal cp " off and " x 1 week , increased stress

## 2019-03-21 HISTORY — PX: EYE SURGERY: SHX253

## 2019-03-23 ENCOUNTER — Encounter: Payer: Self-pay | Admitting: Family Medicine

## 2019-03-23 DIAGNOSIS — H2513 Age-related nuclear cataract, bilateral: Secondary | ICD-10-CM | POA: Diagnosis not present

## 2019-03-23 DIAGNOSIS — H25013 Cortical age-related cataract, bilateral: Secondary | ICD-10-CM | POA: Diagnosis not present

## 2019-03-23 DIAGNOSIS — H25043 Posterior subcapsular polar age-related cataract, bilateral: Secondary | ICD-10-CM | POA: Diagnosis not present

## 2019-03-23 DIAGNOSIS — H2511 Age-related nuclear cataract, right eye: Secondary | ICD-10-CM | POA: Diagnosis not present

## 2019-03-23 DIAGNOSIS — H40013 Open angle with borderline findings, low risk, bilateral: Secondary | ICD-10-CM | POA: Diagnosis not present

## 2019-04-12 DIAGNOSIS — H2511 Age-related nuclear cataract, right eye: Secondary | ICD-10-CM | POA: Diagnosis not present

## 2019-04-12 DIAGNOSIS — H25811 Combined forms of age-related cataract, right eye: Secondary | ICD-10-CM | POA: Diagnosis not present

## 2019-06-06 DIAGNOSIS — H40013 Open angle with borderline findings, low risk, bilateral: Secondary | ICD-10-CM | POA: Diagnosis not present

## 2019-08-01 DIAGNOSIS — H25012 Cortical age-related cataract, left eye: Secondary | ICD-10-CM | POA: Diagnosis not present

## 2019-08-01 DIAGNOSIS — H40013 Open angle with borderline findings, low risk, bilateral: Secondary | ICD-10-CM | POA: Diagnosis not present

## 2019-08-01 DIAGNOSIS — H2512 Age-related nuclear cataract, left eye: Secondary | ICD-10-CM | POA: Diagnosis not present

## 2019-08-01 DIAGNOSIS — H26491 Other secondary cataract, right eye: Secondary | ICD-10-CM | POA: Diagnosis not present

## 2019-08-16 DIAGNOSIS — D2262 Melanocytic nevi of left upper limb, including shoulder: Secondary | ICD-10-CM | POA: Diagnosis not present

## 2019-08-16 DIAGNOSIS — L700 Acne vulgaris: Secondary | ICD-10-CM | POA: Diagnosis not present

## 2019-08-16 DIAGNOSIS — D225 Melanocytic nevi of trunk: Secondary | ICD-10-CM | POA: Diagnosis not present

## 2019-08-16 DIAGNOSIS — D2261 Melanocytic nevi of right upper limb, including shoulder: Secondary | ICD-10-CM | POA: Diagnosis not present

## 2019-08-29 DIAGNOSIS — H0102A Squamous blepharitis right eye, upper and lower eyelids: Secondary | ICD-10-CM | POA: Diagnosis not present

## 2019-08-29 DIAGNOSIS — H0102B Squamous blepharitis left eye, upper and lower eyelids: Secondary | ICD-10-CM | POA: Diagnosis not present

## 2019-08-29 DIAGNOSIS — H0288A Meibomian gland dysfunction right eye, upper and lower eyelids: Secondary | ICD-10-CM | POA: Diagnosis not present

## 2019-08-29 DIAGNOSIS — H0288B Meibomian gland dysfunction left eye, upper and lower eyelids: Secondary | ICD-10-CM | POA: Diagnosis not present

## 2019-10-28 DIAGNOSIS — L819 Disorder of pigmentation, unspecified: Secondary | ICD-10-CM | POA: Diagnosis not present

## 2019-10-31 DIAGNOSIS — H00014 Hordeolum externum left upper eyelid: Secondary | ICD-10-CM | POA: Diagnosis not present

## 2019-11-07 DIAGNOSIS — H00014 Hordeolum externum left upper eyelid: Secondary | ICD-10-CM | POA: Diagnosis not present

## 2019-11-17 DIAGNOSIS — Z01419 Encounter for gynecological examination (general) (routine) without abnormal findings: Secondary | ICD-10-CM | POA: Diagnosis not present

## 2019-11-17 DIAGNOSIS — Z1231 Encounter for screening mammogram for malignant neoplasm of breast: Secondary | ICD-10-CM | POA: Diagnosis not present

## 2019-11-17 DIAGNOSIS — Z6827 Body mass index (BMI) 27.0-27.9, adult: Secondary | ICD-10-CM | POA: Diagnosis not present

## 2019-11-17 DIAGNOSIS — Z1151 Encounter for screening for human papillomavirus (HPV): Secondary | ICD-10-CM | POA: Diagnosis not present

## 2019-12-15 DIAGNOSIS — J0100 Acute maxillary sinusitis, unspecified: Secondary | ICD-10-CM | POA: Diagnosis not present

## 2019-12-15 DIAGNOSIS — R0789 Other chest pain: Secondary | ICD-10-CM | POA: Diagnosis not present

## 2020-01-28 DIAGNOSIS — J019 Acute sinusitis, unspecified: Secondary | ICD-10-CM | POA: Diagnosis not present

## 2020-01-28 DIAGNOSIS — S63612A Unspecified sprain of right middle finger, initial encounter: Secondary | ICD-10-CM | POA: Diagnosis not present

## 2020-03-28 ENCOUNTER — Telehealth: Payer: Self-pay

## 2020-03-28 NOTE — Telephone Encounter (Signed)
I retrieved voicemail from patient requesting refill  propranolol (INDERAL) 10 MG tablet [[id number]]    [id number] - [place]

## 2020-03-29 ENCOUNTER — Encounter: Payer: Self-pay | Admitting: Gastroenterology

## 2020-03-29 NOTE — Telephone Encounter (Signed)
Pt is due for C[facility]/ CPE; Pt sched for CPE

## 2020-03-29 NOTE — Telephone Encounter (Signed)
FYI  Pt did not completely understand that she needs to be seen for rx refill at least once for CPE and another for for any chronic conditions. Pt states that she will like to sched CPE for fasting bloodwork and if have same refill will not need refill for another 2 yrs.

## 2020-04-03 ENCOUNTER — Ambulatory Visit: Payer: BC Managed Care – PPO | Admitting: Family Medicine

## 2020-04-16 DIAGNOSIS — H25042 Posterior subcapsular polar age-related cataract, left eye: Secondary | ICD-10-CM | POA: Diagnosis not present

## 2020-04-16 DIAGNOSIS — H40013 Open angle with borderline findings, low risk, bilateral: Secondary | ICD-10-CM | POA: Diagnosis not present

## 2020-04-16 DIAGNOSIS — H25012 Cortical age-related cataract, left eye: Secondary | ICD-10-CM | POA: Diagnosis not present

## 2020-04-16 DIAGNOSIS — H2512 Age-related nuclear cataract, left eye: Secondary | ICD-10-CM | POA: Diagnosis not present

## 2020-04-16 DIAGNOSIS — H524 Presbyopia: Secondary | ICD-10-CM | POA: Diagnosis not present

## 2020-04-23 ENCOUNTER — Telehealth (INDEPENDENT_AMBULATORY_CARE_PROVIDER_SITE_OTHER): Payer: BC Managed Care – PPO | Admitting: Family Medicine

## 2020-04-23 ENCOUNTER — Encounter: Payer: Self-pay | Admitting: Family Medicine

## 2020-04-23 ENCOUNTER — Other Ambulatory Visit: Payer: Self-pay

## 2020-04-23 DIAGNOSIS — F418 Other specified anxiety disorders: Secondary | ICD-10-CM | POA: Diagnosis not present

## 2020-04-23 DIAGNOSIS — J3489 Other specified disorders of nose and nasal sinuses: Secondary | ICD-10-CM

## 2020-04-23 DIAGNOSIS — R0981 Nasal congestion: Secondary | ICD-10-CM | POA: Diagnosis not present

## 2020-04-23 DIAGNOSIS — J3089 Other allergic rhinitis: Secondary | ICD-10-CM

## 2020-04-23 MED ORDER — DOXYCYCLINE HYCLATE 100 MG PO TABS: 100.0000 mg | ORAL_TABLET | Freq: Two times a day (BID) | ORAL | 0 refills | Status: DC

## 2020-04-23 MED ORDER — LEVOCETIRIZINE DIHYDROCHLORIDE 5 MG PO TABS: 5.0000 mg | ORAL_TABLET | Freq: Every day | ORAL | 3 refills | Status: DC

## 2020-04-23 MED ORDER — FLUTICASONE PROPIONATE 50 MCG/ACT NA SUSP: 2.0000 | Freq: Two times a day (BID) | NASAL | 11 refills | Status: DC

## 2020-04-23 MED ORDER — PROPRANOLOL HCL 10 MG PO TABS: ORAL_TABLET | ORAL | 2 refills | Status: DC

## 2020-04-23 MED ORDER — PREDNISONE 20 MG PO TABS: ORAL_TABLET | ORAL | 0 refills | Status: DC

## 2020-04-23 MED ORDER — MONTELUKAST SODIUM 10 MG PO TABS: 10.0000 mg | ORAL_TABLET | Freq: Every day | ORAL | 3 refills | Status: DC

## 2020-04-23 NOTE — Progress Notes (Signed)
VIRTUAL VISIT VIA VIDEO  I connected with [name] on 04/23/20 at  9:30 AM EST by elemedicine application and verified that I am speaking with the correct person using two identifiers. Location patient: Home Location provider: [facility], Office Persons participating in the virtual visit: Patient, Dr. Raoul Pitch and Samul Dada, CMA  I discussed the limitations of evaluation and management by telemedicine and the availability of in person appointments. The patient expressed understanding and agreed to proceed.   SUBJECTIVE Chief Complaint  Patient presents with  . Nasal Congestion    Pt c/o nasal congestion, nasal dryness, sinus pressure x 2 weeks; worsen x 2 days;     HPI: Shelly Hernandez is a 50 y.o. present for sick visit virtually. She has been scheduled for CPE, but has URI sx, therefore per protocol needed to be changed to a sick virual visit during the [place] pandemic.  She denies fever, chills, nausea or vomit. She complains of nasal congestion, nasal dryness- with some bleeding, sinus pressure for over a week, worse the last 2 days. She has a h/o sinusitis and has had 2 additional episodes since September. She states after abx and steroids in Dec. She was better for a few weeks then her symptoms return. She is not taking her antihistamines. She is taking sudafed routinely.  Exposure:none that she is aware of.  covid vaccine:declined.    ROS: See pertinent positives and negatives per HPI.  Patient Active Problem List   Diagnosis Date Noted  . Cough 07/30/2018  . Performance anxiety 07/30/2018  . Educated about COVID-19 virus infection 07/30/2018  . Overweight (BMI 25.0-29.9) 08/04/2016  . Hyperlipidemia LDL goal <130     Social History   Tobacco Use  . Smoking status: Never Smoker  . Smokeless tobacco: Never Used  Substance Use Topics  . Alcohol use: Yes    Alcohol/week: 6.0 standard drinks    Types: 4 Cans of beer, 2 Shots of liquor per week     Current Outpatient Medications:  .  doxycycline (VIBRA-TABS) 100 MG tablet, Take 1 tablet (100 mg total) by mouth 2 (two) times daily., Disp: 20 tablet, Rfl: 0 .  ibuprofen (ADVIL) 600 MG tablet, Take 1 tablet (600 mg total) by mouth every 6 (six) hours as needed., Disp: 30 tablet, Rfl: 0 .  levocetirizine (XYZAL) 5 MG tablet, Take 1 tablet (5 mg total) by mouth at bedtime., Disp: 90 tablet, Rfl: 3 .  levonorgestrel (MIRENA) 20 MCG/24HR IUD, by Intrauterine route., Disp: , Rfl:  .  montelukast (SINGULAIR) 10 MG tablet, Take 1 tablet (10 mg total) by mouth at bedtime., Disp: 90 tablet, Rfl: 3 .  predniSONE (DELTASONE) 20 MG tablet, 60 mg x3d, 40 mg x3d, 20 mg x2d, 10 mg x2d, Disp: 18 tablet, Rfl: 0 .  pseudoephedrine (SUDAFED) 30 MG tablet, Take 30 mg by mouth daily., Disp: , Rfl:  .  TURMERIC PO, Take 2 tablets by mouth daily., Disp: , Rfl:  .  cetirizine (ZYRTEC) 10 MG tablet, Take 10 mg by mouth daily. (Patient not taking: Reported on 04/23/2020), Disp: , Rfl:  .  fluticasone (FLONASE) 50 MCG/ACT nasal spray, Place 2 sprays into both nostrils in the morning and at bedtime., Disp: 16 mL, Rfl: 11 .  Omega 3 1000 MG CAPS, Take 1 capsule by mouth daily. (Patient not taking: Reported on 04/23/2020), Disp: , Rfl:  .  propranolol (INDERAL) 10 MG tablet, 1 tab 30 min prior to speaking PRN, Disp: 30  tablet, Rfl: 2  Allergies  Allergen Reactions  . Augmentin [Amoxicillin-Pot Clavulanate] Diarrhea    OBJECTIVE: There were no vitals taken for this visit. Gen: No acute distress. Nontoxic in appearance.  HENT: AT. [place].  MMM.  Eyes:Pupils Equal Round Reactive to light, Extraocular movements intact,  Conjunctiva without redness, discharge or icterus. Chest: Cough not present Neuro:Alert. Oriented x3  Psych: Normal affect and demeanor. Normal speech. Normal thought content and judgment.  ASSESSMENT AND PLAN: [name] is a 50 y.o. female present for  Performance anxiety Propanolol is working  well for her PRN for public speaking.   Sinus pressure/Nasal congestion Rest, hydrate.  Start  mucinex (DM if cough) Doxy and pred prescribed, take until completed. Since sx onset > 10 days ago will elect to treat today Pt was instructed she needs to start allergy regimen and stay on regimen to help prevent symptoms from returning.  Avoid chronic use of sudafed. Discussed rebound symptoms.   Non-seasonal allergic rhinitis, unspecified trigger Start floanse Start xyzal qhs Start singulair qhs F/u prn    Howard Pouch, DO 04/23/2020   Return in about 4 weeks (around 05/21/2020) for CPE (30 min).  No orders of the defined types were placed in this encounter.  Meds ordered this encounter  Medications  . propranolol (INDERAL) 10 MG tablet    Sig: 1 tab 30 min prior to speaking PRN    Dispense:  30 tablet    Refill:  2  . doxycycline (VIBRA-TABS) 100 MG tablet    Sig: Take 1 tablet (100 mg total) by mouth 2 (two) times daily.    Dispense:  20 tablet    Refill:  0  . predniSONE (DELTASONE) 20 MG tablet    Sig: 60 mg x3d, 40 mg x3d, 20 mg x2d, 10 mg x2d    Dispense:  18 tablet    Refill:  0  . levocetirizine (XYZAL) 5 MG tablet    Sig: Take 1 tablet (5 mg total) by mouth at bedtime.    Dispense:  90 tablet    Refill:  3  . fluticasone (FLONASE) 50 MCG/ACT nasal spray    Sig: Place 2 sprays into both nostrils in the morning and at bedtime.    Dispense:  16 mL    Refill:  11  . montelukast (SINGULAIR) 10 MG tablet    Sig: Take 1 tablet (10 mg total) by mouth at bedtime.    Dispense:  90 tablet    Refill:  3   Referral Orders  No referral(s) requested today

## 2020-04-23 NOTE — Patient Instructions (Signed)

## 2020-04-24 DIAGNOSIS — H25042 Posterior subcapsular polar age-related cataract, left eye: Secondary | ICD-10-CM | POA: Diagnosis not present

## 2020-04-24 DIAGNOSIS — H25812 Combined forms of age-related cataract, left eye: Secondary | ICD-10-CM | POA: Diagnosis not present

## 2020-04-24 DIAGNOSIS — H25012 Cortical age-related cataract, left eye: Secondary | ICD-10-CM | POA: Diagnosis not present

## 2020-04-24 DIAGNOSIS — H2512 Age-related nuclear cataract, left eye: Secondary | ICD-10-CM | POA: Diagnosis not present

## 2020-05-10 ENCOUNTER — Ambulatory Visit (AMBULATORY_SURGERY_CENTER): Payer: BC Managed Care – PPO | Admitting: *Deleted

## 2020-05-10 ENCOUNTER — Other Ambulatory Visit: Payer: Self-pay

## 2020-05-10 VITALS — Ht 61.0 in | Wt 138.0 lb

## 2020-05-10 DIAGNOSIS — Z1211 Encounter for screening for malignant neoplasm of colon: Secondary | ICD-10-CM

## 2020-05-10 NOTE — Progress Notes (Signed)
No egg or soy allergy known to patient  No issues with past sedation with any surgeries or procedures Patient denies ever being told they had issues or difficulty with intubation  No FH of Malignant Hyperthermia No diet pills per patient No home 02 use per patient  No blood thinners per patient  Pt denies issues with constipation on herbal lax  No A fib or A flutter  EMMI video to pt or via [place] 19 guidelines implemented in PV today with Pt and RN  Pt is fully vaccinated  for Covid     Due to the COVID-19 pandemic we are asking patients to follow certain guidelines.  Pt aware of COVID protocols and [facility] guidelines   Pt verified name, DOB, address and insurance during PV today. Pt mailed instruction packet to included paper to complete and mail back to [facility] with addressed and stamped envelope, Emmi video, copy of consent form to read and not return, and instructions. PV completed over the phone. Pt encouraged to call with questions or issues. My Chart instructions to pt as well

## 2020-05-22 ENCOUNTER — Encounter: Payer: Self-pay | Admitting: Gastroenterology

## 2020-05-23 ENCOUNTER — Encounter: Payer: Self-pay | Admitting: Family Medicine

## 2020-05-23 ENCOUNTER — Encounter: Payer: BC Managed Care – PPO | Admitting: Family Medicine

## 2020-05-24 ENCOUNTER — Other Ambulatory Visit: Payer: Self-pay

## 2020-05-24 ENCOUNTER — Encounter: Payer: Self-pay | Admitting: Gastroenterology

## 2020-05-24 ENCOUNTER — Ambulatory Visit (AMBULATORY_SURGERY_CENTER): Payer: BC Managed Care – PPO | Admitting: Gastroenterology

## 2020-05-24 VITALS — BP 119/71 | HR 65 | Temp 96.2°F | Resp 14 | Ht 61.0 in | Wt 138.0 lb

## 2020-05-24 DIAGNOSIS — D125 Benign neoplasm of sigmoid colon: Secondary | ICD-10-CM

## 2020-05-24 DIAGNOSIS — K635 Polyp of colon: Secondary | ICD-10-CM

## 2020-05-24 DIAGNOSIS — Z1211 Encounter for screening for malignant neoplasm of colon: Secondary | ICD-10-CM | POA: Diagnosis not present

## 2020-05-24 MED ORDER — SODIUM CHLORIDE 0.9 % IV SOLN: 500.0000 mL | Freq: Once | INTRAVENOUS | Status: AC

## 2020-05-24 NOTE — Progress Notes (Signed)
Report given to PACU, vss 

## 2020-05-24 NOTE — Progress Notes (Signed)
Error- network crashed- pt unable to be seen.

## 2020-05-24 NOTE — Progress Notes (Signed)
Called to room to assist during endoscopic procedure.  Patient ID and intended procedure confirmed with present staff. Received instructions for my participation in the procedure from the performing physician.  

## 2020-05-24 NOTE — Patient Instructions (Signed)
Resume previous diet and medications. Await pathology results, repeat colonoscopy based on pathology results.  YOU HAD AN ENDOSCOPIC PROCEDURE TODAY AT [facility]:   Refer to the procedure report that was given to you for any specific questions about what was found during the examination.  If the procedure report does not answer your questions, please call your gastroenterologist to clarify.  If you requested that your care partner not be given the details of your procedure findings, then the procedure report has been included in a sealed envelope for you to review at your convenience later.  YOU SHOULD EXPECT: Some feelings of bloating in the abdomen. Passage of more gas than usual.  Walking can help get rid of the air that was put into your GI tract during the procedure and reduce the bloating. If you had a lower endoscopy (such as a colonoscopy or flexible sigmoidoscopy) you may notice spotting of blood in your stool or on the toilet paper. If you underwent a bowel prep for your procedure, you may not have a normal bowel movement for a few days.  Please Note:  You might notice some irritation and congestion in your nose or some drainage.  This is from the oxygen used during your procedure.  There is no need for concern and it should clear up in a day or so.  SYMPTOMS TO REPORT IMMEDIATELY:   Following lower endoscopy (colonoscopy or flexible sigmoidoscopy):  Excessive amounts of blood in the stool  Significant tenderness or worsening of abdominal pains  Swelling of the abdomen that is new, acute  Fever of 100F or higher   For urgent or emergent issues, a gastroenterologist can be reached at any hour by calling [phone]. Do not use MyChart messaging for urgent concerns.    DIET:  We do recommend a small meal at first, but then you may proceed to your regular diet.  Drink plenty of fluids but you should avoid alcoholic beverages for 24 hours.  ACTIVITY:  You should  plan to take it easy for the rest of today and you should NOT DRIVE or use heavy machinery until tomorrow (because of the sedation medicines used during the test).    FOLLOW UP: Our staff will call the number listed on your records 48-72 hours following your procedure to check on you and address any questions or concerns that you may have regarding the information given to you following your procedure. If we do not reach you, we will leave a message.  We will attempt to reach you two times.  During this call, we will ask if you have developed any symptoms of COVID 19. If you develop any symptoms (ie: fever, flu-like symptoms, shortness of breath, cough etc.) before then, please call [phone].  If you test positive for Covid 19 in the 2 weeks post procedure, please call and report this information to [place].    If any biopsies were taken you will be contacted by phone or by letter within the next 1-3 weeks.  Please call us at [phone] if you have not heard about the biopsies in 3 weeks.    SIGNATURES/CONFIDENTIALITY: You and/or your care partner have signed paperwork which will be entered into your electronic medical record.  These signatures attest to the fact that that the information above on your After Visit Summary has been reviewed and is understood.  Full responsibility of the confidentiality of this discharge information lies with you and/or your care-partner.

## 2020-05-24 NOTE — Op Note (Signed)
[facility] Patient Name: Shelly Hernandez Procedure Date: 05/24/2020 10:07 AM MRN: [id number] Endoscopist: Thornton Park MD, MD Age: 50 Referring MD:  Date of Birth: Apr 08, 1970 Gender: Female Account #: [id number] Procedure:                Colonoscopy Indications:              Screening for colorectal malignant neoplasm, This                            is the patient's first colonoscopy                           Father and sister with colon polyps                           No known family history of colon cancer or polyps Medicines:                Monitored Anesthesia Care Procedure:                Pre-Anesthesia Assessment:                           - Prior to the procedure, a History and Physical                            was performed, and patient medications and                            allergies were reviewed. The patient's tolerance of                            previous anesthesia was also reviewed. The risks                            and benefits of the procedure and the sedation                            options and risks were discussed with the patient.                            All questions were answered, and informed consent                            was obtained. Prior Anticoagulants: The patient has                            taken no previous anticoagulant or antiplatelet                            agents. ASA Grade Assessment: II - A patient with                            mild systemic disease. After reviewing the risks  and benefits, the patient was deemed in                            satisfactory condition to undergo the procedure.                           After obtaining informed consent, the colonoscope                            was passed under direct vision. Throughout the                            procedure, the patient's blood pressure, pulse, and                            oxygen saturations were monitored  continuously. The                            Olympus CF-HQ190 [id number]) Colonoscope was                            introduced through the anus and advanced to the 6                            cm into the ileum. The colonoscopy was performed                            without difficulty. The patient tolerated the                            procedure well. The quality of the bowel                            preparation was good. The terminal ileum, ileocecal                            valve, appendiceal orifice, and rectum were                            photographed. Scope In: 10:12:23 AM Scope Out: 10:27:18 AM Scope Withdrawal Time: 0 hours 11 minutes 49 seconds  Total Procedure Duration: 0 hours 14 minutes 55 seconds  Findings:                 The perianal and digital rectal examinations were                            normal.                           Multiple small and large-mouthed diverticula were                            found in the sigmoid colon and descending colon.  A 2 mm polyp was found in the proximal sigmoid                            colon. The polyp was sessile. The polyp was removed                            with a cold snare. Resection and retrieval were                            complete.                           A diffuse area of mild melanosis was found in the                            entire colon.                           The exam was otherwise without abnormality on                            direct and retroflexion views except for internal                            hemorrhoids. Complications:            No immediate complications. Estimated blood loss:                            Minimal. Estimated Blood Loss:     Estimated blood loss was minimal. Impression:               - Diverticulosis in the sigmoid colon.                           - One 2 mm polyp in the proximal sigmoid colon,                            removed with a  cold snare. Resected and retrieved.                           - Melanosis in the colon.                           - The examination was otherwise normal on direct                            and retroflexion views. Recommendation:           - Patient has a contact number available for                            emergencies. The signs and symptoms of potential                            delayed complications were discussed with  the                            patient. Return to normal activities tomorrow.                            Written discharge instructions were provided to the                            patient.                           - Resume previous diet.                           - Continue present medications.                           - Await pathology results.                           - Repeat colonoscopy date to be determined after                            pending pathology results are reviewed for                            surveillance factoring in the family history.                           - Emerging evidence supports eating a diet of                            fruits, vegetables, grains, calcium, and yogurt                            while reducing red meat and alcohol may reduce the                            risk of colon cancer.                           - Thank you for allowing me to be involved in your                            colon cancer prevention. Thornton Park MD, MD 05/24/2020 10:33:13 AM This report has been signed electronically.

## 2020-05-24 NOTE — Progress Notes (Signed)
Pt's states no medical or surgical changes since previsit or office visit.  VS taken by [place]

## 2020-05-25 ENCOUNTER — Telehealth (INDEPENDENT_AMBULATORY_CARE_PROVIDER_SITE_OTHER): Payer: BC Managed Care – PPO | Admitting: Family Medicine

## 2020-05-25 ENCOUNTER — Ambulatory Visit (HOSPITAL_BASED_OUTPATIENT_CLINIC_OR_DEPARTMENT_OTHER)
Admission: RE | Admit: 2020-05-25 | Discharge: 2020-05-25 | Disposition: A | Discharge status: Home / Self Care | Payer: BC Managed Care – PPO | Source: Ambulatory Visit | Attending: Family Medicine | Admitting: Family Medicine

## 2020-05-25 ENCOUNTER — Encounter: Payer: Self-pay | Admitting: Family Medicine

## 2020-05-25 DIAGNOSIS — J301 Allergic rhinitis due to pollen: Secondary | ICD-10-CM | POA: Diagnosis not present

## 2020-05-25 DIAGNOSIS — J329 Chronic sinusitis, unspecified: Secondary | ICD-10-CM | POA: Diagnosis not present

## 2020-05-25 MED ORDER — AZELASTINE HCL 0.1 % NA SOLN: 2.0000 | Freq: Two times a day (BID) | NASAL | 12 refills | Status: DC

## 2020-05-25 MED ORDER — LEVOFLOXACIN 500 MG PO TABS: 500.0000 mg | ORAL_TABLET | Freq: Every day | ORAL | 0 refills | Status: DC

## 2020-05-25 MED ORDER — PREDNISONE 20 MG PO TABS: ORAL_TABLET | ORAL | 0 refills | Status: DC

## 2020-05-25 MED ORDER — IPRATROPIUM BROMIDE 0.06 % NA SOLN: 2.0000 | Freq: Four times a day (QID) | NASAL | 12 refills | Status: DC | PRN

## 2020-05-25 MED ORDER — HYDROXYZINE HCL 25 MG PO TABS: 25.0000 mg | ORAL_TABLET | Freq: Every day | ORAL | 1 refills | Status: DC

## 2020-05-25 NOTE — Progress Notes (Signed)
VIRTUAL VISIT VIA VIDEO  I connected with [name] on 05/25/20 at  8:00 AM EDT by elemedicine application and verified that I am speaking with the correct person using two identifiers. Location patient: Home Location provider: [facility], Office Persons participating in the virtual visit: Patient, Dr. Raoul Pitch and Darnell Level. Cesar, CMA  I discussed the limitations of evaluation and management by telemedicine and the availability of in person appointments. The patient expressed understanding and agreed to proceed.   SUBJECTIVE Chief Complaint  Patient presents with  . Nasal Congestion    Pt c/o nasal congestion, nasal dryness, nasal drainage with yellow and red tint, nasal bleeding, sinus pressure, teeth pain x 1.5 mos; no improvement     HPI: Shelly Hernandez is a 50 y.o. female present for acute on chronic illness of nasal congestion, nasal dryness, nasal drainage, sinus pressure, ear pressure, teeth pain, bloody nose. Denies fever, chills, n/v.   ROS: See pertinent positives and negatives per HPI.  Patient Active Problem List   Diagnosis Date Noted  . Cough 07/30/2018  . Performance anxiety 07/30/2018  . Educated about COVID-19 virus infection 07/30/2018  . Overweight (BMI 25.0-29.9) 08/04/2016  . Hyperlipidemia LDL goal <130     Social History   Tobacco Use  . Smoking status: Never Smoker  . Smokeless tobacco: Never Used  Substance Use Topics  . Alcohol use: Yes    Alcohol/week: 6.0 standard drinks    Types: 4 Cans of beer, 2 Shots of liquor per week    Current Outpatient Medications:  .  APPLE CIDER VINEGAR PO, Take by mouth. Gummies, Disp: , Rfl:  .  azelastine (ASTELIN) 0.1 % nasal spray, Place 2 sprays into both nostrils 2 (two) times daily. Use in each nostril as directed, Disp: 30 mL, Rfl: 12 .  B Complex Vitamins (B COMPLEX 1 PO), Take by mouth., Disp: , Rfl:  .  cholecalciferol (VITAMIN D3) 25 MCG (1000 UNIT) tablet, Take 1,000 Units by mouth daily.,  Disp: , Rfl:  .  Collagen-Vitamin C-Biotin (COLLAGEN 1500/C PO), Take by mouth., Disp: , Rfl:  .  ELDERBERRY PO, Take by mouth. Airborne, Disp: , Rfl:  .  fluticasone (FLONASE) 50 MCG/ACT nasal spray, Place 2 sprays into both nostrils in the morning and at bedtime., Disp: 16 mL, Rfl: 11 .  guaiFENesin (MUCINEX) 600 MG 12 hr tablet, Take by mouth 2 (two) times daily., Disp: , Rfl:  .  hydrOXYzine (ATARAX/VISTARIL) 25 MG tablet, Take 1-2 tablets (25-50 mg total) by mouth at bedtime., Disp: 180 tablet, Rfl: 1 .  ipratropium (ATROVENT) 0.06 % nasal spray, Place 2 sprays into both nostrils 4 (four) times daily as needed for rhinitis., Disp: 15 mL, Rfl: 12 .  levocetirizine (XYZAL) 5 MG tablet, Take 1 tablet (5 mg total) by mouth at bedtime., Disp: 90 tablet, Rfl: 3 .  levofloxacin (LEVAQUIN) 500 MG tablet, Take 1 tablet (500 mg total) by mouth daily., Disp: 7 tablet, Rfl: 0 .  levonorgestrel (MIRENA) 20 MCG/24HR IUD, by Intrauterine route., Disp: , Rfl:  .  montelukast (SINGULAIR) 10 MG tablet, Take 1 tablet (10 mg total) by mouth at bedtime., Disp: 90 tablet, Rfl: 3 .  Multiple Vitamins-Minerals (ONE-A-DAY WOMENS 50+ ADVANTAGE PO), Take by mouth., Disp: , Rfl:  .  Omega 3 1000 MG CAPS, Take 1 capsule by mouth daily., Disp: , Rfl:  .  OVER THE COUNTER MEDICATION, Herbal supplement to help move bowels- digestive herbal lax, Disp: , Rfl:  .  predniSONE (DELTASONE) 20 MG tablet, 60 mg x3d, 40 mg x3d, 20 mg x2d, 10 mg x2d, Disp: 18 tablet, Rfl: 0 .  propranolol (INDERAL) 10 MG tablet, 1 tab 30 min prior to speaking PRN, Disp: 30 tablet, Rfl: 2 .  psyllium (METAMUCIL) 58.6 % powder, Take 1 packet by mouth 3 (three) times daily. Daily fiber in coffee, Disp: , Rfl:  .  TURMERIC PO, Take 2 tablets by mouth daily., Disp: , Rfl:  .  vitamin C (ASCORBIC ACID) 500 MG tablet, Take 500 mg by mouth daily., Disp: , Rfl:  .  zinc gluconate 50 MG tablet, Take 50 mg by mouth daily., Disp: , Rfl:   Current  Facility-Administered Medications:  .  0.9 %  sodium chloride infusion, 500 mL, Intravenous, Once, Thornton Park, MD  Allergies  Allergen Reactions  . Augmentin [Amoxicillin-Pot Clavulanate] Diarrhea    OBJECTIVE: There were no vitals taken for this visit. Gen: No acute distress. Nontoxic in appearance. Nasal congestion.  HENT: AT. [place].  MMM. Puffiness around eyes.  Eyes:Pupils Equal Round Reactive to light, Extraocular movements intact,  Conjunctiva without redness, discharge or icterus. Chest: Cough not present on exam Skin: no rashes, purpura or petechiae.  Neuro: Alert. Oriented x3  Psych: Normal affect and demeanor. Normal speech. Normal thought content and judgment.  ASSESSMENT AND PLAN: [name] is a 50 y.o. female present for  Non-seasonal allergic rhinitis due to pollen - recurrent allergies and sinus infections - worsening last 2 years.  - Ambulatory referral to Allergy Recurrent sinus infections Recurrent sinus infections and rhinitis - worsening last 1-2 years after they moved, but present for years - DG Sinuses Complete; Future Rest, hydrate.  Continue  mucinex (DM if cough), nettie pot or nasal saline.  LQ and prednisone  prescribed, take until completed.  continue singular, xyzal Start astelin and atrovent nasal sprays.  Start vistaril qhs Stop sudafed.  Consider referral back to Dr. Redmond Baseman - ENT if needed after sinus xray.  F/U 2 weeks if not improved.    Howard Pouch, DO 05/25/2020   Return if symptoms worsen or fail to improve.  Orders Placed This Encounter  Procedures  . DG Sinuses Complete  . Ambulatory referral to Allergy   Meds ordered this encounter  Medications  . hydrOXYzine (ATARAX/VISTARIL) 25 MG tablet    Sig: Take 1-2 tablets (25-50 mg total) by mouth at bedtime.    Dispense:  180 tablet    Refill:  1  . predniSONE (DELTASONE) 20 MG tablet    Sig: 60 mg x3d, 40 mg x3d, 20 mg x2d, 10 mg x2d    Dispense:  18 tablet    Refill:   0  . levofloxacin (LEVAQUIN) 500 MG tablet    Sig: Take 1 tablet (500 mg total) by mouth daily.    Dispense:  7 tablet    Refill:  0  . azelastine (ASTELIN) 0.1 % nasal spray    Sig: Place 2 sprays into both nostrils 2 (two) times daily. Use in each nostril as directed    Dispense:  30 mL    Refill:  12  . ipratropium (ATROVENT) 0.06 % nasal spray    Sig: Place 2 sprays into both nostrils 4 (four) times daily as needed for rhinitis.    Dispense:  15 mL    Refill:  12    Referral Orders     Ambulatory referral to Allergy

## 2020-05-25 NOTE — Patient Instructions (Signed)

## 2020-05-28 ENCOUNTER — Telehealth: Payer: Self-pay

## 2020-05-28 NOTE — Telephone Encounter (Signed)
  Follow up Call-  Call back number 05/24/2020  Post procedure Call Back phone  # [phone]  Permission to leave phone message Yes  Some recent data might be hidden     Patient questions:  Do you have a fever, pain , or abdominal swelling? No. Pain Score  0 *  Have you tolerated food without any problems? Yes.    Have you been able to return to your normal activities? Yes.    Do you have any questions about your discharge instructions: Diet   No. Medications  No. Follow up visit  No.  Do you have questions or concerns about your Care? No.  Actions: * If pain score is 4 or above: No action needed, pain <4.

## 2020-05-31 ENCOUNTER — Encounter: Payer: Self-pay | Admitting: Gastroenterology

## 2020-06-22 DIAGNOSIS — Z20822 Contact with and (suspected) exposure to covid-19: Secondary | ICD-10-CM | POA: Diagnosis not present

## 2020-07-11 NOTE — Progress Notes (Signed)
New Patient Note  RE: Shelly Hernandez MRN: [id number] DOB: 10-Dec-1970 Date of Office Visit: 07/12/2020  Consult requested by: Ma Hillock, DO Primary care provider: Ma Hillock, DO  Chief Complaint: Sinus Problem  History of Present Illness: I had the pleasure of seeing Shelly Hernandez for initial evaluation at the [facility] on 07/12/2020. She is a 50 y.o. female, who is referred here by [name], DO for the evaluation of sinus issues.   She reports symptoms of nasal congestion, headaches, sinus pressure/pain, PND. Symptoms have been going on for 20 years but worsening the past 5 years The symptoms are present all year around with worsening in the fall and spring. Anosmia: diminished sense of smell. Headache: yes. She has used sudafed, Flonase (had epistaxis), Xyzal, Singulair, hydroxyzine, azelastine nasal spray, ipratropium nasal spray with some improvement in symptoms. Sinus infections: yes - finished steroids and antibiotics. Previous work up includes: none. Previous ENT evaluation: 10 years ago and treated with higher dose antibiotics. No prior surgery. Previous sinus imaging: sinus X-ray 05/25/2020 "Midline nasal septum. Imaged paranasal sinuses appear clear and without opacity or fluid level"  History of nasal polyps: no. History of reflux: sometimes and takes tums prn.  05/25/2020 PCP visit: "HPI: Shelly Hernandez is a 49 y.o. female present for acute on chronic illness of nasal congestion, nasal dryness, nasal drainage, sinus pressure, ear pressure, teeth pain, bloody nose. Denies fever, chills, n/v. "  Assessment and Plan: Taytem is a 50 y.o. female with: Chronic rhinitis Perennial rhinitis symptoms with worsening in the fall and spring for the last 20 years.  Has sinus infections which required multiple courses of antibiotics and steroids.  Tried Sudafed, Flonase, Xyzal, Singulair, hydroxyzine, azelastine and ipratropium nasal spray with some  benefit.  No prior allergy evaluation.  Saw ENT 10 years ago.  Recent sinus x-ray was unremarkable.  Today's skin testing showed: Negative to indoor/outdoor allergens.   Use Nasacort nasal spray 1 spray per nostril twice a day as needed for nasal congestion.   Use Atrovent (ipratropium) 0.06% 1-2 sprays per nostril up to four times a day as needed for runny nose/drainage.  Nasal saline spray (i.e., Simply Saline) or nasal saline lavage (i.e., NeilMed) is recommended as needed and prior to medicated nasal sprays.  STOP Singulair.   STOP hydroxyzine.   Use over the counter antihistamines such as Zyrtec (cetirizine), Claritin (loratadine), Allegra (fexofenadine), or Xyzal (levocetirizine) daily as needed. May switch antihistamines every few months.  Noticed worsening symptoms since off antihistamines.   Frequent sinus infections  Keep track of infections.  At the first sign, start saline irrigations twice a day.  Consider getting basic immune evaluation bloodwork if persistent.   Other adverse food reactions, not elsewhere classified, subsequent encounter Noticed increased nasal symptoms after consuming alcohol.  Today's skin testing negative to wheat, barley, oat, rye, hops.  Most likely has a non-Ige mediated response to alcohol.  Limit alcohol intake.   Return in about 5 months (around 12/12/2020).  No orders of the defined types were placed in this encounter.  Lab Orders  No laboratory test(s) ordered today    Other allergy screening: Asthma: no Food allergy: no  Sometimes white flour causes some GI issues. Certain beers cause nasal congestion.  Medication allergy: Augmentin - GI issues but tolerates amoxicillin Hymenoptera allergy: no Urticaria: no Eczema:no History of recurrent infections suggestive of immunodeficency: no  Diagnostics: Skin Testing: Environmental allergy panel and select foods.  Negative to indoor/outdoor allergens.  Negative to select  foods. Results discussed with patient/family.  Airborne Adult Perc - 07/12/20 1119    Time Antigen Placed 1119    Allergen Manufacturer Lavella Hammock    Location Back    Number of Test 59    Panel 1 Select    1. Control-Buffer 50% Glycerol Negative    2. Control-Histamine 1 mg/ml 2+    3. Albumin saline Negative    4. Holloway Negative    5. [place] Negative    6. Johnson Negative    7. Freeport Blue Negative    8. Meadow Fescue Negative    9. Perennial Rye Negative    10. Sweet Vernal Negative    11. Timothy Negative    12. Cocklebur Negative    13. Burweed Marshelder Negative    14. Ragweed, short Negative    15. Ragweed, Giant Negative    16. Plantain,  English Negative    17. Lamb's Quarters Negative    18. Sheep Sorrell Negative    19. Rough Pigweed Negative    20. Marsh Elder, Rough Negative    21. Mugwort, Common Negative    22. Ash mix Negative    23. Birch mix Negative    24. Beech American Negative    25. Box, Elder Negative    26. Cedar, red Negative    27. Cottonwood, [place] Negative    28. Elm mix Negative    29. Hickory Negative    30. Maple mix Negative    31. Oak, [place] mix Negative    32. Pecan Pollen Negative    33. Pine mix Negative    34. Sycamore Eastern Negative    35. Wat[place]a, Black Pollen Negative    36. Alternaria alternata Negative    37. Cladosporium Herbarum Negative    38. Aspergillus mix Negative    39. Penicillium mix Negative    40. Bipolaris sorokiniana (Helminthosporium) Negative    41. Drechslera spicifera (Curvularia) Negative    42. Mucor plumbeus Negative    43. Fusarium moniliforme Negative    44. Aureobasidium pullulans (pullulara) Negative    45. Rhizopus oryzae Negative    46. Botrytis cinera Negative    47. Epicoccum nigrum Negative    48. Phoma betae Negative    49. Candida Albicans Negative    50. Trichophyton mentagrophytes Negative    51. Mite, D Farinae  5,000 AU/ml Negative    52. Mite, D Pteronyssinus  5,000 AU/ml  Negative    53. Cat Hair 10,000 BAU/ml Negative    54.  Dog Epithelia Negative    55. Mixed Feathers Negative    56. Horse Epithelia Negative    57. Cockroach, German Negative    58. Mouse Negative    59. Tobacco Leaf Negative          Intradermal - 07/12/20 1213    Time Antigen Placed 1213    Allergen Manufacturer Lavella Hammock    Location Arm    Number of Test 15    Intradermal Select    Control Negative    Guatemala Negative    Johnson Negative    7 Grass Negative    Ragweed mix Negative    Weed mix Negative    Tree mix Negative    Mold 1 Negative    Mold 2 Negative    Mold 3 Negative    Mold 4 Negative    Cat Negative    Dog Negative    Cockroach Negative  Mite mix Negative          Food Adult Perc - 07/12/20 1100    Time Antigen Placed 1120    Allergen Manufacturer Lavella Hammock    Location Back    Number of allergen test 5    Control-Histamine 1 mg/ml 2+    3. Wheat Negative    30. Barley Negative    31. Oat  Negative    32. Rye  Negative    33. Hops Negative           Past Medical History: Patient Active Problem List   Diagnosis Date Noted  . Chronic rhinitis 07/12/2020  . Frequent sinus infections 07/12/2020  . Other adverse food reactions, not elsewhere classified, subsequent encounter 07/12/2020  . Cough 07/30/2018  . Performance anxiety 07/30/2018  . Educated about COVID-19 virus infection 07/30/2018  . Overweight (BMI 25.0-29.9) 08/04/2016  . Hyperlipidemia LDL goal <130    Past Medical History:  Diagnosis Date  . Allergy   . 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  . Arthritis    not diagnosed- hands toes   . GERD (gastroesophageal reflux disease)    occ   . Hyperlipidemia LDL goal <130   . IBS (irritable bowel syndrome)    Past Surgical History: Past Surgical History:  Procedure Laterality Date  . EYE SURGERY  03/2019  . REDUCTION MAMMAPLASTY  1990   Medication List:  Current Outpatient Medications  Medication Sig Dispense Refill   . APPLE CIDER VINEGAR PO Take by mouth. Gummies    . azelastine (ASTELIN) 0.1 % nasal spray Place 2 sprays into both nostrils 2 (two) times daily. Use in each nostril as directed 30 mL 12  . B Complex Vitamins (B COMPLEX 1 PO) Take by mouth.    . cholecalciferol (VITAMIN D3) 25 MCG (1000 UNIT) tablet Take 1,000 Units by mouth daily.    . Collagen-Vitamin C-Biotin (COLLAGEN 1500/C PO) Take by mouth.    . ELDERBERRY PO Take by mouth. Airborne    . guaiFENesin (MUCINEX) 600 MG 12 hr tablet Take by mouth 2 (two) times daily.    Marland Kitchen ibuprofen (ADVIL) 200 MG tablet Take by mouth.    Marland Kitchen ipratropium (ATROVENT) 0.06 % nasal spray Place 2 sprays into both nostrils 4 (four) times daily as needed for rhinitis. 15 mL 12  . levocetirizine (XYZAL) 5 MG tablet Take 1 tablet (5 mg total) by mouth at bedtime. 90 tablet 3  . levonorgestrel (MIRENA) 20 MCG/24HR IUD by Intrauterine route.    . Multiple Vitamins-Minerals (ONE-A-DAY WOMENS 50+ ADVANTAGE PO) Take by mouth.    . Omega 3 1000 MG CAPS Take 1 capsule by mouth daily.    Marland Kitchen OVER THE COUNTER MEDICATION Herbal supplement to help move bowels- digestive herbal lax    . propranolol (INDERAL) 10 MG tablet 1 tab 30 min prior to speaking PRN 30 tablet 2  . psyllium (METAMUCIL) 58.6 % powder Take 1 packet by mouth 3 (three) times daily. Daily fiber in coffee    . TURMERIC PO Take 2 tablets by mouth daily.    . vitamin C (ASCORBIC ACID) 500 MG tablet Take 500 mg by mouth daily.    Marland Kitchen zinc gluconate 50 MG tablet Take 50 mg by mouth daily.     Current Facility-Administered Medications  Medication Dose Route Frequency Provider Last Rate Last Admin  . 0.9 %  sodium chloride infusion  500 mL Intravenous Once [place] Park, MD  Allergies: Allergies  Allergen Reactions  . Amoxicillin-Pot Clavulanate Diarrhea and Nausea And Vomiting    Pt states she can tolerate amoxicillin    Social History: Social History   Socioeconomic History  . Marital status: Married     Spouse name: Herbie Baltimore  . Number of children: 1  . Years of education: 33  . Highest education level: Not on file  Occupational History  . Occupation: Banker  Tobacco Use  . Smoking status: Never Smoker  . Smokeless tobacco: Never Used  Vaping Use  . Vaping Use: Never used  Substance and Sexual Activity  . Alcohol use: Yes    Alcohol/week: 6.0 standard drinks    Types: 4 Cans of beer, 2 Shots of liquor per week  . Drug use: No  . Sexual activity: Yes    Partners: Male    Birth control/protection: I.U.D.  Other Topics Concern  . Not on file  Social History Narrative   Married to [place]. 1 son Hulen Skains.    B.A. Degree, works as a Customer service manager.    Drinks caffeine. Daily vitamin use.   Wears her seatbelt, bicycle helmet, smoke detector in the home. Firearms locked in the home.    Feels safe in her relationships.    Social Determinants of Health   Financial Resource Strain: Not on file  Food Insecurity: Not on file  Transportation Needs: Not on file  Physical Activity: Not on file  Stress: Not on file  Social Connections: Not on file   Lives in a house built in 2020. Smoking: denies Occupation: Firefighter HistoryFreight forwarder in the house: no Charity fundraiser in the family room: no Carpet in the bedroom: no Heating: gas and electric Cooling: central Pet: yes 2 small dogs x 15 yrs, 9 yrs  Family History: Family History  Problem Relation Age of Onset  . Diabetes Mother   . Arthritis Mother   . Diabetes Father   . Hearing loss Father   . Heart disease Father   . Colon polyps Father   . Angioedema Father   . Colon polyps Sister   . Bipolar disorder Son   . Diabetes Maternal Aunt   . Arthritis Maternal Aunt   . Heart disease Maternal Uncle   . Diabetes Paternal Aunt   . Stroke Maternal Grandmother   . Arthritis Maternal Grandmother   . Esophageal cancer Maternal Grandfather   . COPD Maternal Grandfather   . Diabetes Paternal Grandmother   . Heart disease  Paternal Grandfather   . Colon cancer Neg Hx   . Stomach cancer Neg Hx   . Rectal cancer Neg Hx    Problem                               Relation Asthma                                   No  Eczema                                No  Food allergy                          No  Allergic rhino conjunctivitis     No   Review of Systems  Constitutional:  Negative for appetite change, chills, fever and unexpected weight change.  HENT: Positive for congestion and postnasal drip. Negative for rhinorrhea.   Eyes: Negative for itching.  Respiratory: Negative for cough, chest tightness, shortness of breath and wheezing.   Cardiovascular: Negative for chest pain.  Gastrointestinal: Negative for abdominal pain.  Genitourinary: Negative for difficulty urinating.  Skin: Negative for rash.  Allergic/Immunologic: Negative for environmental allergies and food allergies.  Neurological: Positive for headaches.   Objective: BP 122/70   Pulse 65   Temp (!) 96.8 F (36 C) (Temporal)   Resp 17   Ht 5\' 1"  (1.549 m)   Wt 142 lb (64.4 kg)   SpO2 99%   BMI 26.83 kg/m  Body mass index is 26.83 kg/m. Physical Exam Vitals and nursing note reviewed.  Constitutional:      Appearance: Normal appearance. She is well-developed.  HENT:     Head: Normocephalic and atraumatic.     Right Ear: Tympanic membrane and external ear normal.     Left Ear: Tympanic membrane and external ear normal.     Nose: Nose normal.     Mouth/Throat:     Mouth: Mucous membranes are moist.     Pharynx: Oropharynx is clear.  Eyes:     Conjunctiva/sclera: Conjunctivae normal.  Cardiovascular:     Rate and Rhythm: Normal rate and regular rhythm.     Heart sounds: Normal heart sounds. No murmur heard. No friction rub. No gallop.   Pulmonary:     Effort: Pulmonary effort is normal.     Breath sounds: Normal breath sounds. No wheezing, rhonchi or rales.  Musculoskeletal:     Cervical back: Neck supple.  Skin:    General: Skin  is warm.     Findings: No rash.  Neurological:     Mental Status: She is alert and oriented to person, place, and time.  Psychiatric:        Behavior: Behavior normal.    The plan was reviewed with the patient/family, and all questions/concerned were addressed.  It was my pleasure to see Cesar today and participate in her care. Please feel free to contact me with any questions or concerns.  Sincerely,  Rexene Alberts, DO Allergy & Immunology  [facility] office: [place] office: [phone]

## 2020-07-12 ENCOUNTER — Other Ambulatory Visit: Payer: Self-pay

## 2020-07-12 ENCOUNTER — Encounter: Payer: Self-pay | Admitting: Allergy

## 2020-07-12 ENCOUNTER — Ambulatory Visit: Payer: BC Managed Care – PPO | Admitting: Allergy

## 2020-07-12 VITALS — BP 122/70 | HR 65 | Temp 96.8°F | Resp 17 | Ht 61.0 in | Wt 142.0 lb

## 2020-07-12 DIAGNOSIS — J310 Chronic rhinitis: Secondary | ICD-10-CM | POA: Insufficient documentation

## 2020-07-12 DIAGNOSIS — J329 Chronic sinusitis, unspecified: Secondary | ICD-10-CM | POA: Diagnosis not present

## 2020-07-12 DIAGNOSIS — T781XXD Other adverse food reactions, not elsewhere classified, subsequent encounter: Secondary | ICD-10-CM | POA: Insufficient documentation

## 2020-07-12 NOTE — Patient Instructions (Addendum)
Today's skin testing showed: Negative to indoor/outdoor allergens.  Negative to select foods. Results given.   Rhinitis:   Use Nasacort nasal spray 1 spray per nostril twice a day as needed for nasal congestion.   Use Atrovent (ipratropium) 0.06% 1-2 sprays per nostril up to four times a day as needed for runny nose/drainage.  Nasal saline spray (i.e., Simply Saline) or nasal saline lavage (i.e., NeilMed) is recommended as needed and prior to medicated nasal sprays.  STOP Singulair.   STOP hydroxyzine.   Use over the counter antihistamines such as Zyrtec (cetirizine), Claritin (loratadine), Allegra (fexofenadine), or Xyzal (levocetirizine) daily as needed. May switch antihistamines every few months.  Sinus infections  Keep track of infections.  At the first sign, start saline irrigations twice a day as below.  Limit alcohol intake.   Follow up in 5 months or sooner if needed.   Buffered Isotonic Saline Irrigations:  Goal: . When you irrigate with the isotonic saline (salt water) it washes mucous and other debris from your nose that could be contributing to your nasal symptoms.   Recipe: Marland Kitchen Obtain 1 quart jar that is clean . Fill with clean (bottled, boiled or distilled) water . Add 1-2 heaping teaspoons of salt without iodine o If the solution with 2 teaspoons of salt is too strong, adjust the amount down until better tolerated . Add 1 teaspoon of Arm [employer] Hammer baking soda (pure bicarbonate) . Mix ingredients together and store at room temperature and discard after 1 week * Alternatively you can buy pre made salt packets for the NeilMed bottle or there          are other over the counter brands available  Instructions: . Warm  cup of the solution in the microwave if desired but be careful not to overheat as this will burn the inside of your nose . Stand over a sink (or do it while you shower) and squirt the solution into one side of your nose aiming towards the back of  your head o Sometimes saying "coca cola" while irrigating can be helpful to prevent fluid from going down your throat  . The solution will travel to the back of your nose and then come out the other side . Perform this again on the other side . Try to do this twice a day . If you are using a nasal spray in addition to the irrigation, irrigate first and then use the topical nasal spray otherwise you will wash the nasal spray out of your nose

## 2020-07-12 NOTE — Assessment & Plan Note (Signed)
   Keep track of infections.  At the first sign, start saline irrigations twice a day.  Consider getting basic immune evaluation bloodwork if persistent.

## 2020-07-12 NOTE — Assessment & Plan Note (Signed)
Perennial rhinitis symptoms with worsening in the fall and spring for the last 20 years.  Has sinus infections which required multiple courses of antibiotics and steroids.  Tried Sudafed, Flonase, Xyzal, Singulair, hydroxyzine, azelastine and ipratropium nasal spray with some benefit.  No prior allergy evaluation.  Saw ENT 10 years ago.  Recent sinus x-ray was unremarkable.  Today's skin testing showed: Negative to indoor/outdoor allergens.   Use Nasacort nasal spray 1 spray per nostril twice a day as needed for nasal congestion.   Use Atrovent (ipratropium) 0.06% 1-2 sprays per nostril up to four times a day as needed for runny nose/drainage.  Nasal saline spray (i.e., Simply Saline) or nasal saline lavage (i.e., NeilMed) is recommended as needed and prior to medicated nasal sprays.  STOP Singulair.   STOP hydroxyzine.   Use over the counter antihistamines such as Zyrtec (cetirizine), Claritin (loratadine), Allegra (fexofenadine), or Xyzal (levocetirizine) daily as needed. May switch antihistamines every few months.  Noticed worsening symptoms since off antihistamines.

## 2020-07-12 NOTE — Assessment & Plan Note (Signed)
Noticed increased nasal symptoms after consuming alcohol.  Today's skin testing negative to wheat, barley, oat, rye, hops.  Most likely has a non-Ige mediated response to alcohol.  Limit alcohol intake.

## 2020-07-18 ENCOUNTER — Telehealth (INDEPENDENT_AMBULATORY_CARE_PROVIDER_SITE_OTHER): Payer: BC Managed Care – PPO | Admitting: Family Medicine

## 2020-07-18 ENCOUNTER — Encounter: Payer: Self-pay | Admitting: Family Medicine

## 2020-07-18 DIAGNOSIS — J029 Acute pharyngitis, unspecified: Secondary | ICD-10-CM

## 2020-07-18 DIAGNOSIS — R059 Cough, unspecified: Secondary | ICD-10-CM | POA: Diagnosis not present

## 2020-07-18 MED ORDER — PREDNISONE 20 MG PO TABS: ORAL_TABLET | ORAL | 0 refills | Status: DC

## 2020-07-18 NOTE — Progress Notes (Signed)
VIRTUAL VISIT VIA VIDEO  I connected with Shelly Hernandez on 07/19/20 at  4:00 PM EDT by elemedicine application and verified that I am speaking with the correct person using two identifiers. Location patient: Home Location provider: [facility], Office Persons participating in the virtual visit: Patient, Dr. Raoul Pitch and Darnell Level. Cesar, CMA  I discussed the limitations of evaluation and management by telemedicine and the availability of in person appointments. The patient expressed understanding and agreed to proceed.   SUBJECTIVE Chief Complaint  Patient presents with  . Covid Exposure    Pt c/o sore throat, fatigue, sinus drainage, post nasal drip, HA, body ache, chills, sweats x 2 days; at home test was neg yesterday;     HPI: Shelly Hernandez is a 50 y.o. female present for covid like illness.  She reports symptoms of sore throat, fatigue, sinus drainage, postnasal drip, headache, body aches, chills, sweats of 2 days duration.  She states she took a home test yesterday at onset of symptoms and it was negative.  She was at the beach over the weekend in which she could have been exposed.  She also was around her niece Thursday, and which she later became COVID-positive. She denies fevers, cough or shortness of breath. She has had x3 Moderna boosters.  ROS: See pertinent positives and negatives per HPI.  Patient Active Problem List   Diagnosis Date Noted  . Chronic rhinitis 07/12/2020  . Frequent sinus infections 07/12/2020  . Other adverse food reactions, not elsewhere classified, subsequent encounter 07/12/2020  . Cough 07/30/2018  . Performance anxiety 07/30/2018  . Educated about COVID-19 virus infection 07/30/2018  . Overweight (BMI 25.0-29.9) 08/04/2016  . Hyperlipidemia LDL goal <130     Social History   Tobacco Use  . Smoking status: Never Smoker  . Smokeless tobacco: Never Used  Substance Use Topics  . Alcohol use: Yes    Alcohol/week: 6.0 standard drinks     Types: 4 Cans of beer, 2 Shots of liquor per week    Current Outpatient Medications:  .  APPLE CIDER VINEGAR PO, Take by mouth. Gummies, Disp: , Rfl:  .  azelastine (ASTELIN) 0.1 % nasal spray, Place 2 sprays into both nostrils 2 (two) times daily. Use in each nostril as directed, Disp: 30 mL, Rfl: 12 .  ELDERBERRY PO, Take by mouth. Airborne, Disp: , Rfl:  .  ibuprofen (ADVIL) 200 MG tablet, Take by mouth., Disp: , Rfl:  .  ipratropium (ATROVENT) 0.06 % nasal spray, Place 2 sprays into both nostrils 4 (four) times daily as needed for rhinitis., Disp: 15 mL, Rfl: 12 .  levocetirizine (XYZAL) 5 MG tablet, Take 1 tablet (5 mg total) by mouth at bedtime., Disp: 90 tablet, Rfl: 3 .  levonorgestrel (MIRENA) 20 MCG/24HR IUD, by Intrauterine route., Disp: , Rfl:  .  Multiple Vitamins-Minerals (ONE-A-DAY WOMENS 50+ ADVANTAGE PO), Take by mouth., Disp: , Rfl:  .  nirmatrelvir/ritonavir EUA (PAXLOVID) TABS, Take 3 tablets by mouth 2 (two) times daily for 5 days. Patient GFR is >90.Take nirmatrelvir (150 mg) two tablets twice daily for 5 days and ritonavir (100 mg) one tablet twice daily for 5 days., Disp: 30 tablet, Rfl: 0 .  Omega 3 1000 MG CAPS, Take 1 capsule by mouth daily., Disp: , Rfl:  .  OVER THE COUNTER MEDICATION, Herbal supplement to help move bowels- digestive herbal lax, Disp: , Rfl:  .  predniSONE (DELTASONE) 20 MG tablet, 60 mg x3d, 40 mg x3d, 20  mg x2d, 10 mg x2d, Disp: 18 tablet, Rfl: 0 .  propranolol (INDERAL) 10 MG tablet, 1 tab 30 min prior to speaking PRN, Disp: 30 tablet, Rfl: 2 .  TURMERIC PO, Take 2 tablets by mouth daily., Disp: , Rfl:  .  vitamin C (ASCORBIC ACID) 500 MG tablet, Take 500 mg by mouth daily., Disp: , Rfl:  .  zinc gluconate 50 MG tablet, Take 50 mg by mouth daily., Disp: , Rfl:  .  B Complex Vitamins (B COMPLEX 1 PO), Take by mouth. (Patient not taking: Reported on 07/18/2020), Disp: , Rfl:  .  cholecalciferol (VITAMIN D3) 25 MCG (1000 UNIT) tablet, Take 1,000 Units  by mouth daily. (Patient not taking: Reported on 07/18/2020), Disp: , Rfl:   Current Facility-Administered Medications:  .  0.9 %  sodium chloride infusion, 500 mL, Intravenous, Once, [place], MD  Allergies  Allergen Reactions  . Amoxicillin-Pot Clavulanate Diarrhea and Nausea And Vomiting    Pt states she can tolerate amoxicillin     OBJECTIVE: There were no vitals taken for this visit. Gen: No acute distress. Nontoxic in appearance.  HENT: AT. [place].  MMM.  Eyes:Pupils Equal Round Reactive to light, Extraocular movements intact,  Conjunctiva without redness, discharge or icterus. Chest: Cough present. No shortness of breath. Skin: no rashes, purpura or petechiae.  Neuro:  Normal gait. Alert. Oriented x3   ASSESSMENT AND PLAN: Shelly Hernandez is a 50 y.o. female present for  Sore throat/cough Possible bacterial or viral illness.  Rest. Hydrate.  mucinex DM, allergy regimen.  OTC chloraseptic, tylenol.  Testing set up for tomorrow morning. Pt will be called with results. - Novel Coronavirus, NAA ([facility]); Future> if +> paxlovid - POCT Influenza A/B; Future> if +, tamiflu> negative - POCT rapid strep A; Future if + azith or doxy> negative Prednisone taper prescribed for sore throat, swollen glands.  Pt will be called w/ results and appropriate medication prescribed.     Howard Pouch, DO 07/19/2020   Return if symptoms worsen or fail to improve.  Orders Placed This Encounter  Procedures  . Novel Coronavirus, NAA ([facility])  . POCT Influenza A/B  . POCT rapid strep A   Meds ordered this encounter  Medications  . predniSONE (DELTASONE) 20 MG tablet    Sig: 60 mg x3d, 40 mg x3d, 20 mg x2d, 10 mg x2d    Dispense:  18 tablet    Refill:  0  . nirmatrelvir/ritonavir EUA (PAXLOVID) TABS    Sig: Take 3 tablets by mouth 2 (two) times daily for 5 days. Patient GFR is >90.Take nirmatrelvir (150 mg) two tablets twice daily for 5 days and ritonavir (100 mg) one tablet twice  daily for 5 days.    Dispense:  30 tablet    Refill:  0   Referral Orders  No referral(s) requested today

## 2020-07-18 NOTE — Patient Instructions (Signed)

## 2020-07-19 ENCOUNTER — Other Ambulatory Visit: Payer: Self-pay | Admitting: Family Medicine

## 2020-07-19 ENCOUNTER — Ambulatory Visit (INDEPENDENT_AMBULATORY_CARE_PROVIDER_SITE_OTHER): Payer: BC Managed Care – PPO

## 2020-07-19 ENCOUNTER — Other Ambulatory Visit: Payer: Self-pay

## 2020-07-19 ENCOUNTER — Telehealth: Payer: Self-pay | Admitting: Family Medicine

## 2020-07-19 DIAGNOSIS — J029 Acute pharyngitis, unspecified: Secondary | ICD-10-CM

## 2020-07-19 LAB — POCT INFLUENZA A/B
Influenza A, POC: NEGATIVE
Influenza B, POC: NEGATIVE

## 2020-07-19 LAB — POCT RAPID STREP A (OFFICE): Rapid Strep A Screen: NEGATIVE

## 2020-07-19 MED ORDER — NIRMATRELVIR/RITONAVIR (PAXLOVID)TABLET: 3.0000 | ORAL_TABLET | Freq: Two times a day (BID) | ORAL | 0 refills | Status: AC

## 2020-07-19 NOTE — Telephone Encounter (Signed)
Spoke with pt regarding labs and instructions.   

## 2020-07-19 NOTE — Telephone Encounter (Signed)
Please inform patient her influenza and strep test were negative. Her COVID test is still pending, per our conversation I would suspect it is going to be positive since she did have an exposure.  I have called in paxlovid oral antiviral therapy for COVID-19.  It is 3 tabs every 12 hours for 5 days.  Medication should be started within 5 days of onset of symptoms.  - If she desires she can wait until her COVID test returns which will likely be within the next 24 hours.  However, with exposure and symptoms it would not be wrong to go ahead and start now.   Please make sure she is aware if she is on any type of birth control, this medication can make birth control methods less effective.

## 2020-07-20 ENCOUNTER — Telehealth: Payer: Self-pay | Admitting: Family Medicine

## 2020-07-20 LAB — NOVEL CORONAVIRUS, NAA: SARS-CoV-2, NAA: NOT DETECTED

## 2020-07-20 LAB — SARS-COV-2, NAA 2 DAY TAT

## 2020-07-20 MED ORDER — DOXYCYCLINE HYCLATE 100 MG PO TABS: 100.0000 mg | ORAL_TABLET | Freq: Two times a day (BID) | ORAL | 0 refills | Status: DC

## 2020-07-20 NOTE — Telephone Encounter (Signed)
Please call patient: Her COVID test actually came back negative.  Given her symptoms and her direct exposure this may be a false negative, or she may just have another viral illness that is causing similar symptoms.  I would recommend she go ahead and take the Paxlovid.  I also called in doxycycline antibiotic for her.  She only needs to pick this up and start the doxycycline if her symptoms are improving by early next week on paxlovid alone.

## 2020-07-20 NOTE — Telephone Encounter (Signed)
Spoke with pt regarding labs and instructions. Pt stated she is improving and has been taking the Paxlovid starting last night

## 2020-08-12 ENCOUNTER — Other Ambulatory Visit: Payer: Self-pay | Admitting: Family Medicine

## 2020-12-11 ENCOUNTER — Ambulatory Visit: Payer: BC Managed Care – PPO | Admitting: Allergy

## 2021-01-03 DIAGNOSIS — H524 Presbyopia: Secondary | ICD-10-CM | POA: Diagnosis not present

## 2021-01-03 DIAGNOSIS — H0288A Meibomian gland dysfunction right eye, upper and lower eyelids: Secondary | ICD-10-CM | POA: Diagnosis not present

## 2021-01-03 DIAGNOSIS — H40013 Open angle with borderline findings, low risk, bilateral: Secondary | ICD-10-CM | POA: Diagnosis not present

## 2021-01-03 DIAGNOSIS — H43391 Other vitreous opacities, right eye: Secondary | ICD-10-CM | POA: Diagnosis not present

## 2021-01-03 DIAGNOSIS — H0102A Squamous blepharitis right eye, upper and lower eyelids: Secondary | ICD-10-CM | POA: Diagnosis not present

## 2021-01-17 DIAGNOSIS — Z124 Encounter for screening for malignant neoplasm of cervix: Secondary | ICD-10-CM | POA: Diagnosis not present

## 2021-01-17 DIAGNOSIS — Z6827 Body mass index (BMI) 27.0-27.9, adult: Secondary | ICD-10-CM | POA: Diagnosis not present

## 2021-01-17 DIAGNOSIS — Z01411 Encounter for gynecological examination (general) (routine) with abnormal findings: Secondary | ICD-10-CM | POA: Diagnosis not present

## 2021-01-17 DIAGNOSIS — Z1231 Encounter for screening mammogram for malignant neoplasm of breast: Secondary | ICD-10-CM | POA: Diagnosis not present

## 2021-01-17 DIAGNOSIS — Z01419 Encounter for gynecological examination (general) (routine) without abnormal findings: Secondary | ICD-10-CM | POA: Diagnosis not present

## 2021-01-17 LAB — HM MAMMOGRAPHY

## 2021-02-19 DIAGNOSIS — D1801 Hemangioma of skin and subcutaneous tissue: Secondary | ICD-10-CM | POA: Diagnosis not present

## 2021-02-19 DIAGNOSIS — L811 Chloasma: Secondary | ICD-10-CM | POA: Diagnosis not present

## 2021-02-19 DIAGNOSIS — L700 Acne vulgaris: Secondary | ICD-10-CM | POA: Diagnosis not present

## 2021-04-16 ENCOUNTER — Other Ambulatory Visit: Payer: Self-pay | Admitting: Family Medicine

## 2021-05-05 IMAGING — DX DG SINUSES COMPLETE 3+V
3 series · 3 of 3 positions shown · non-contrast
Comparison: None.

CLINICAL DATA: Recurrent sinus infection

EXAM:
PARANASAL SINUSES - COMPLETE 3 + VIEW

[pns waters]
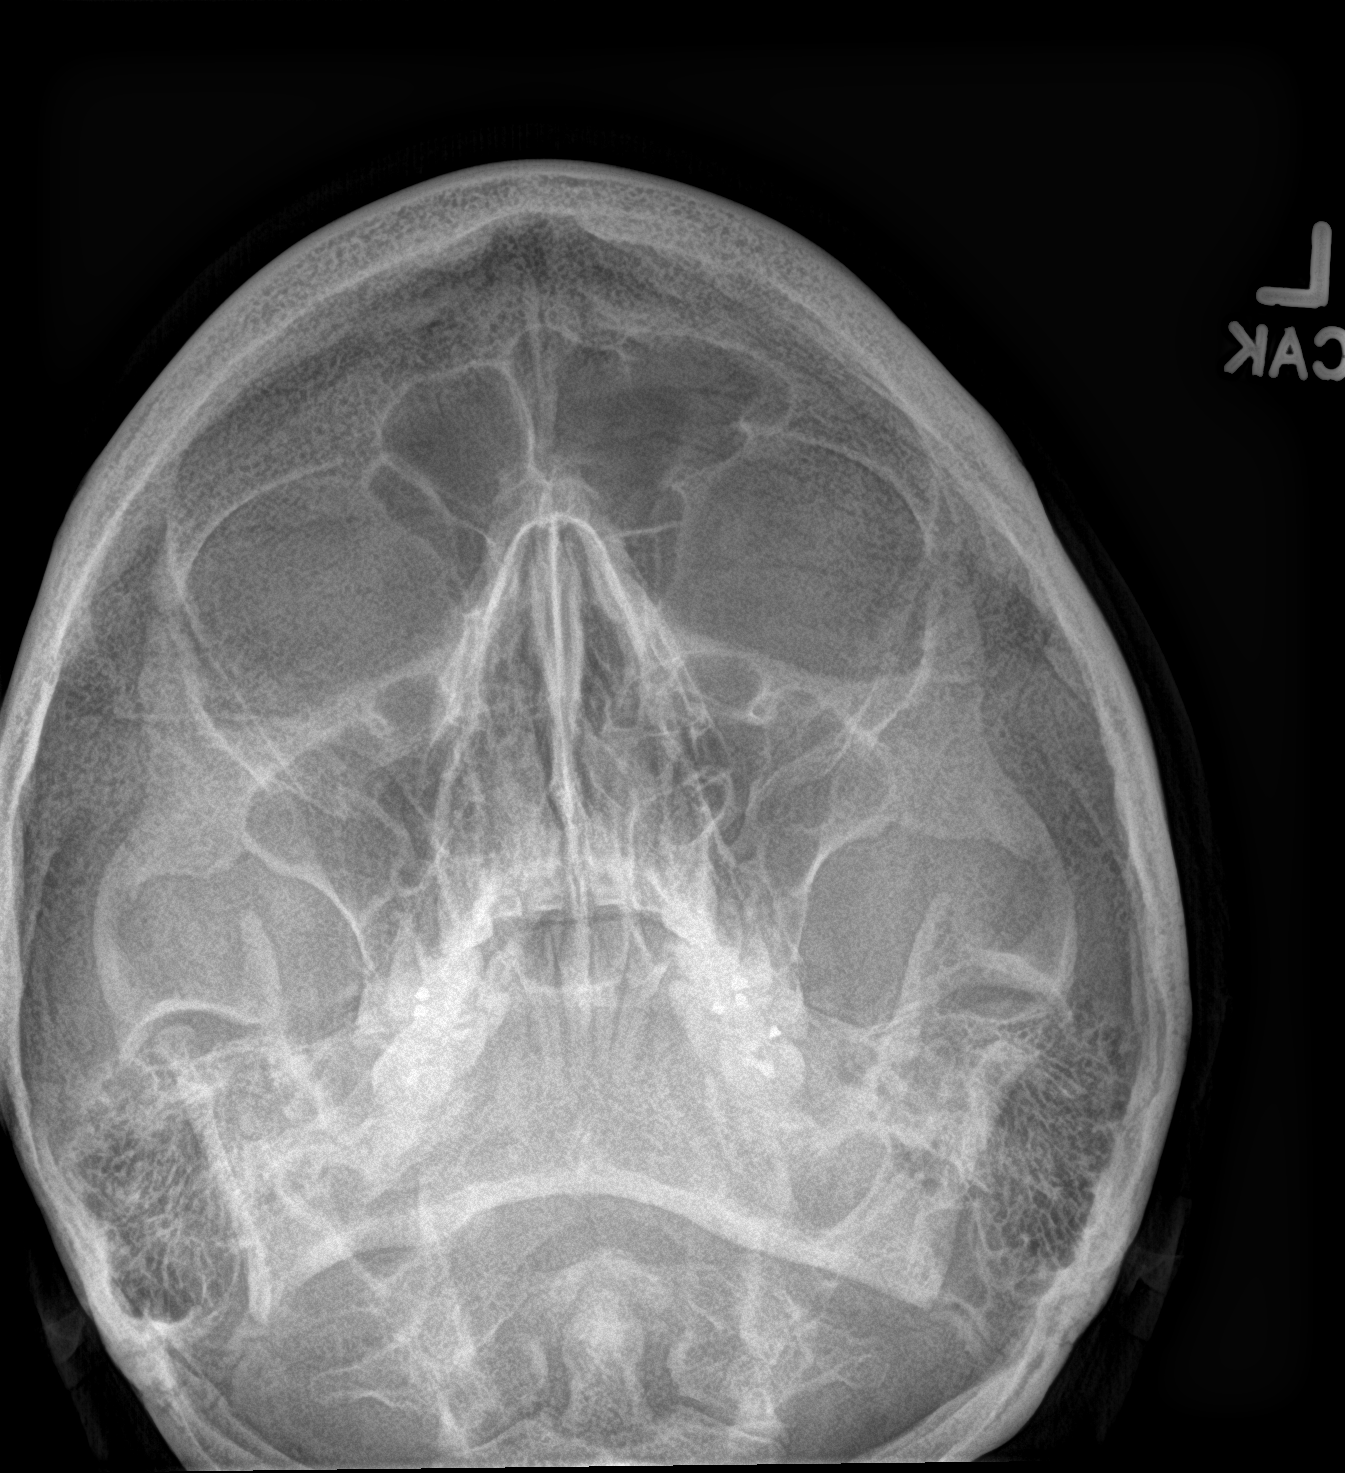

[[person_name]]
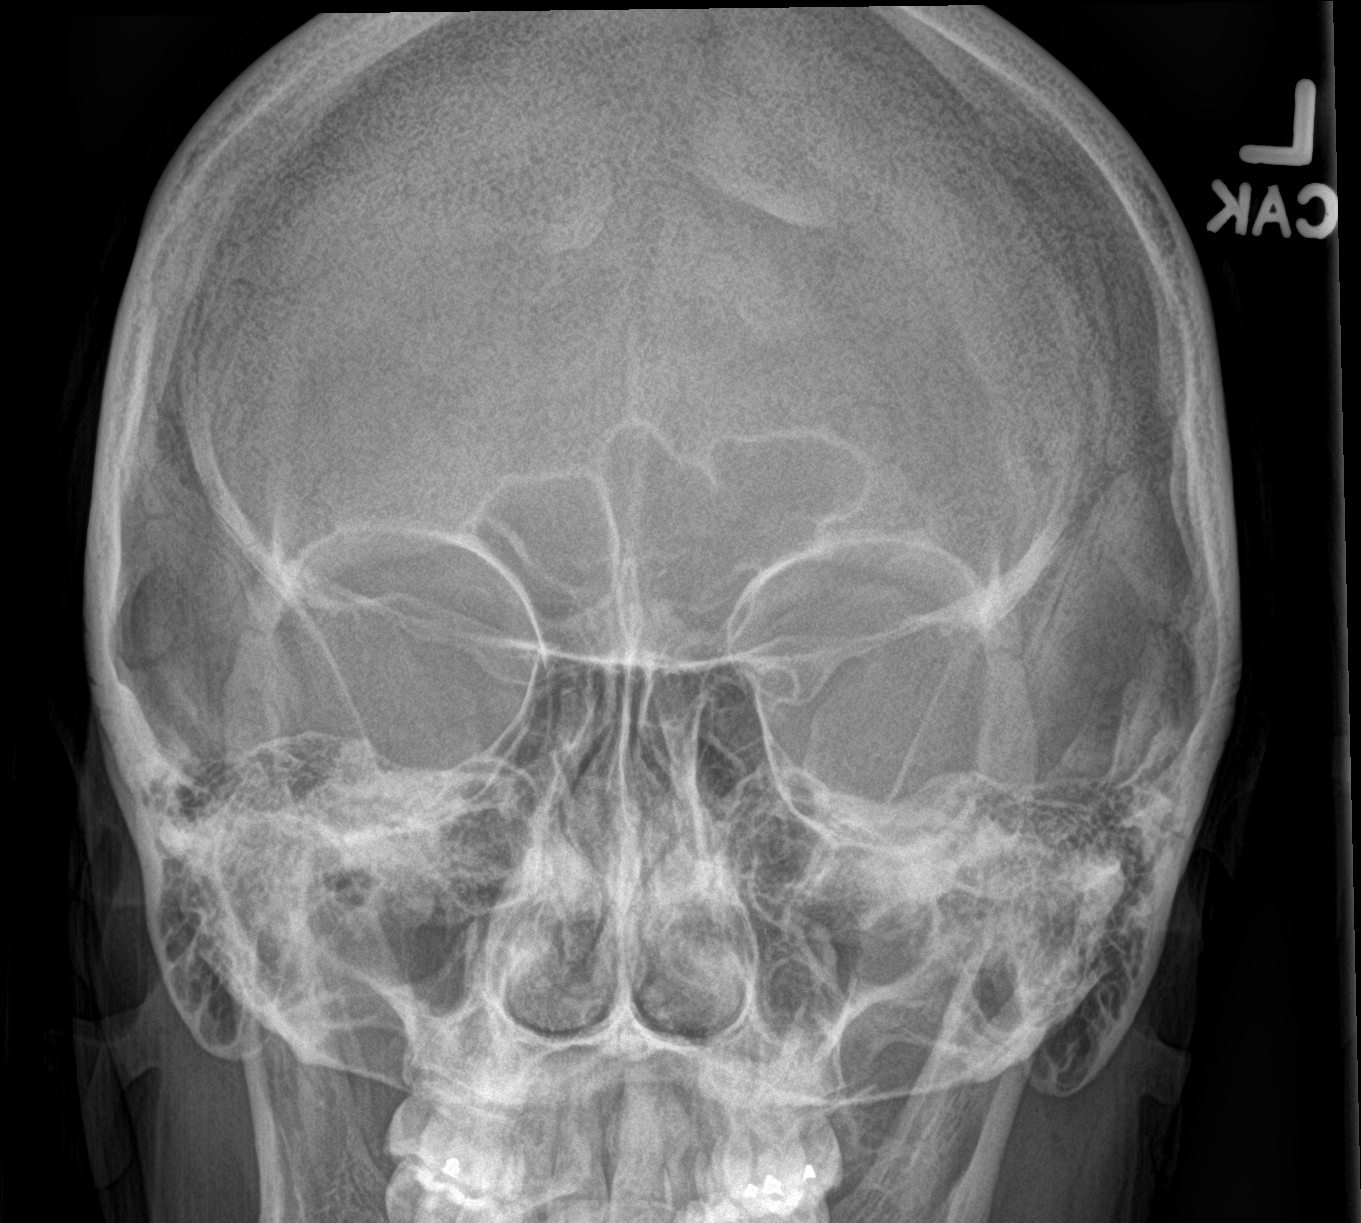

[pns lat]
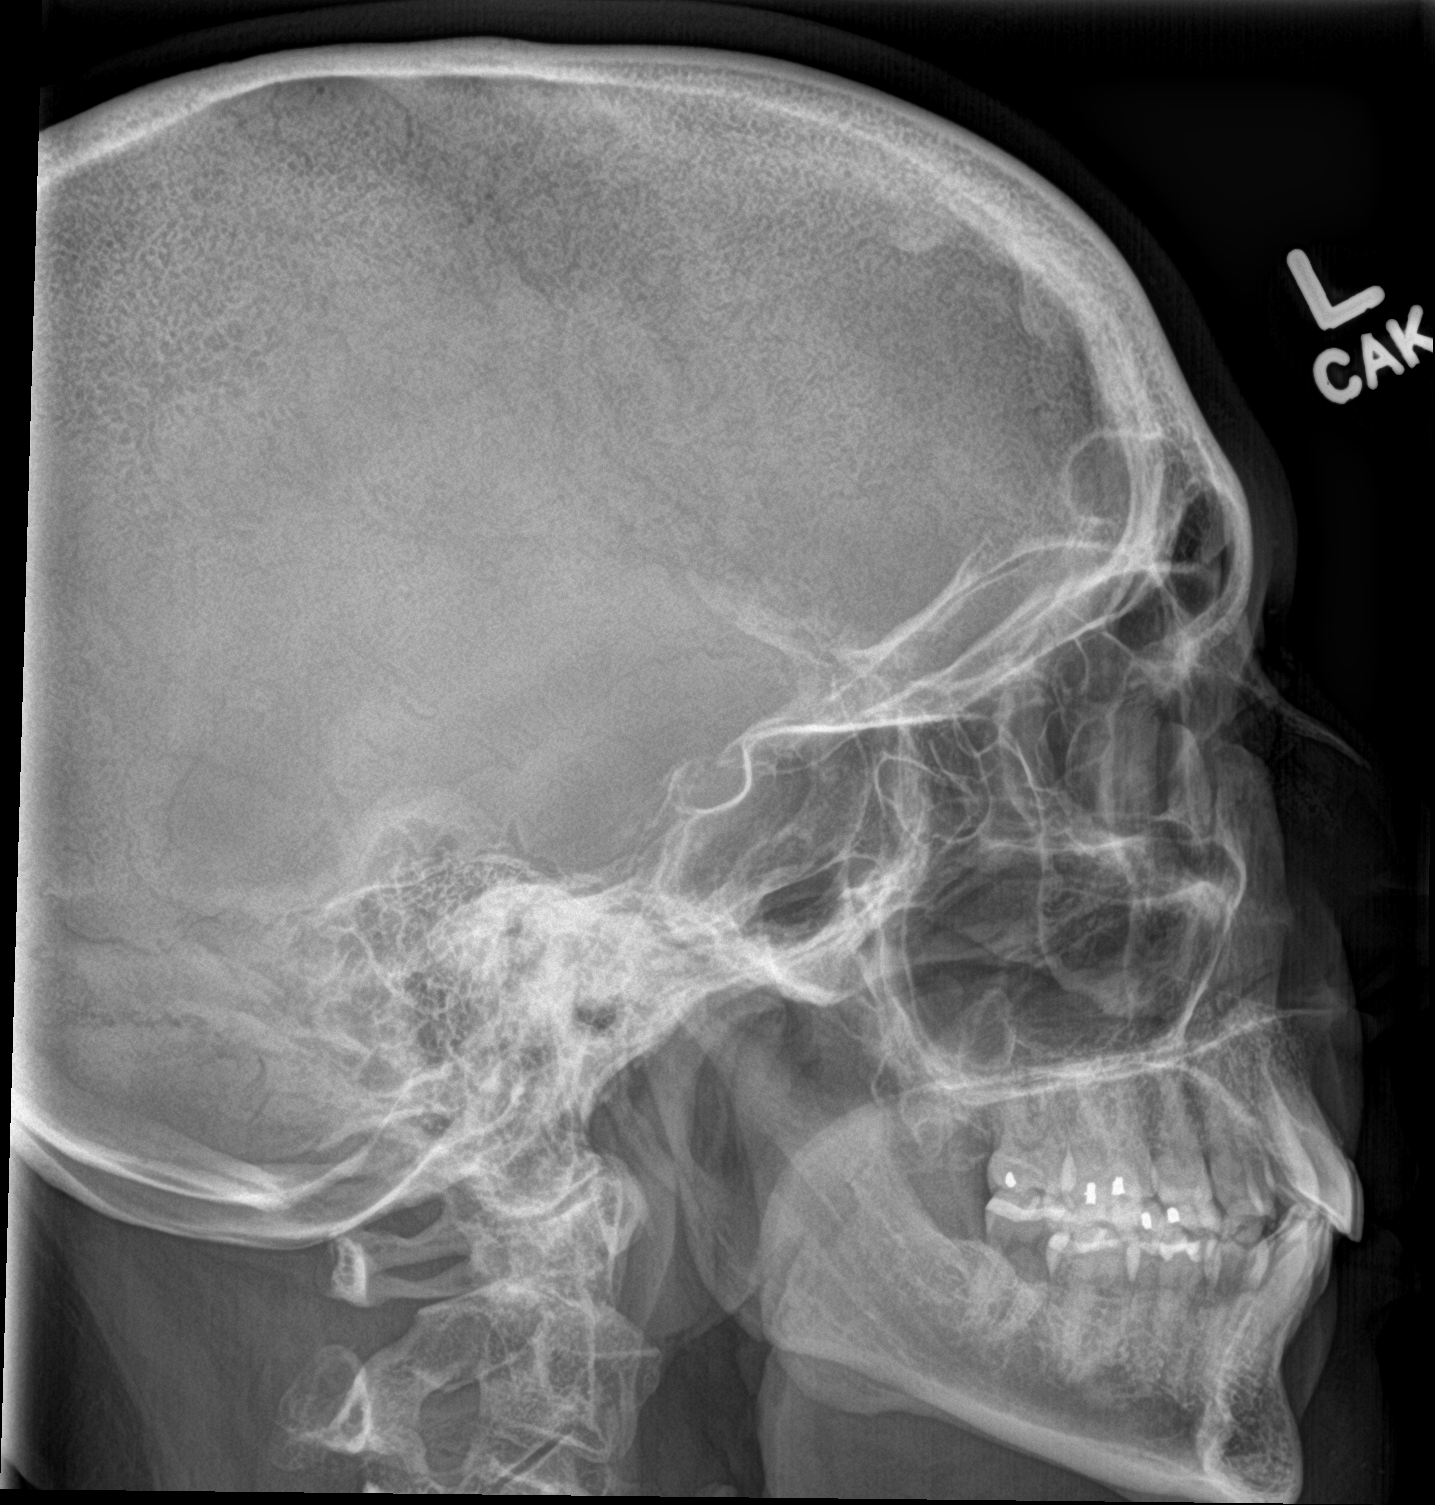

[3 of 3 positions shown; findings below may reference images not displayed]

FINDINGS: Midline nasal septum. Imaged paranasal sinuses appear clear and
without opacity or fluid level.
IMPRESSION: Negative.

## 2021-05-06 ENCOUNTER — Encounter: Payer: Self-pay | Admitting: Family Medicine

## 2021-05-06 ENCOUNTER — Ambulatory Visit: Payer: BC Managed Care – PPO | Admitting: Family Medicine

## 2021-05-06 ENCOUNTER — Other Ambulatory Visit: Payer: Self-pay

## 2021-05-06 ENCOUNTER — Telehealth: Payer: Self-pay | Admitting: Family Medicine

## 2021-05-06 VITALS — BP 130/81 | HR 61 | Temp 98.2°F | Ht 61.0 in | Wt 146.0 lb

## 2021-05-06 DIAGNOSIS — F418 Other specified anxiety disorders: Secondary | ICD-10-CM

## 2021-05-06 DIAGNOSIS — E785 Hyperlipidemia, unspecified: Secondary | ICD-10-CM | POA: Diagnosis not present

## 2021-05-06 DIAGNOSIS — E663 Overweight: Secondary | ICD-10-CM

## 2021-05-06 DIAGNOSIS — R7301 Impaired fasting glucose: Secondary | ICD-10-CM | POA: Insufficient documentation

## 2021-05-06 DIAGNOSIS — J310 Chronic rhinitis: Secondary | ICD-10-CM | POA: Diagnosis not present

## 2021-05-06 MED ORDER — IPRATROPIUM BROMIDE 0.06 % NA SOLN: 2.0000 | Freq: Four times a day (QID) | NASAL | 12 refills | Status: DC | PRN

## 2021-05-06 MED ORDER — PROPRANOLOL HCL 10 MG PO TABS: ORAL_TABLET | ORAL | 1 refills | Status: DC

## 2021-05-06 MED ORDER — LEVOCETIRIZINE DIHYDROCHLORIDE 5 MG PO TABS: 5.0000 mg | ORAL_TABLET | Freq: Every day | ORAL | 3 refills | Status: AC

## 2021-05-06 NOTE — Progress Notes (Signed)
This visit occurred during the SARS-CoV-2 public health emergency.  Safety protocols were in place, including screening questions prior to the visit, additional usage of staff PPE, and extensive cleaning of exam room while observing appropriate contact time as indicated for disinfecting solutions.    Shelly Hernandez , 1970-09-04, 51 y.o., female MRN: [id number] Patient Care Team    Relationship Specialty Notifications Start End  Natalia Leatherwood, DO PCP - General Family Medicine  08/04/16   Olivia Mackie, MD Consulting Physician Obstetrics and Gynecology  08/04/16   Christia Reading, MD Consulting Physician Otolaryngology  02/03/17     Chief Complaint  Patient presents with   Hyperlipidemia    Cmc; pt is not fasting     Subjective: Pt presents for an OV with plaints of hyperlipidemia and weight gain.  Patient had labs completed at her employment which resulted with a normal CBC, normal CMP with exception of mildly elevated fasting glucose of 103.  Total cholesterol 270, triglycerides 214, LDL 182.  She has gained approximately 8 pounds over the last year.  Around approximately 6 pounds the year prior.  She feels she is unable to lose weight.  She feels she eats healthy.  She does not exercise routinely.  She does not count calories.  She has been on many types of diets in the past.  Her cholesterol in 2018 was elevated approximately where it is now, and she started following a pescatarian diet and was able to bring her LDL down to 111. He states she has went back to eating red meat and not watching her diet as closely.  She does not use butter and mainly uses olive oil.  Weight today 146.Body mass index is 27.59 kg/m.  With hyperlipidemia.  Depression screen [facility] 2/9 05/06/2021 05/23/2020 08/04/2016  Decreased Interest 0 0 0  Down, Depressed, Hopeless 0 0 0  PHQ - 2 Score 0 0 0    Allergies  Allergen Reactions   Amoxicillin-Pot Clavulanate Diarrhea and Nausea And Vomiting    Pt states  she can tolerate amoxicillin    Social History   Social History Narrative   Married to [place]. 1 son Lindie Spruce.    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 Degree, works as a Psychologist, occupational.    Drinks caffeine. Daily vitamin use.   Wears her seatbelt, bicycle helmet, smoke detector in the home. Firearms locked in the home.    Feels safe in her relationships.    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   Arthritis    not diagnosed- hands toes    GERD (gastroesophageal reflux disease)    occ    Hyperlipidemia LDL goal <130    IBS (irritable bowel syndrome)    Past Surgical History:  Procedure Laterality Date   EYE SURGERY  03/2019   REDUCTION MAMMAPLASTY  1990   Family History  Problem Relation Age of Onset   Diabetes Mother    Arthritis Mother    Diabetes Father    Hearing loss Father    Heart disease Father    Colon polyps Father    Angioedema Father    Colon polyps Sister    Bipolar disorder Son    Diabetes Maternal Aunt    Arthritis Maternal Aunt    Heart disease Maternal Uncle    Diabetes Paternal Aunt    Stroke Maternal Grandmother    Arthritis Maternal Grandmother    Esophageal  cancer Maternal Grandfather    COPD Maternal Grandfather    Diabetes Paternal Grandmother    Heart disease Paternal Grandfather    Colon cancer Neg Hx    Stomach cancer Neg Hx    Rectal cancer Neg Hx    Allergies as of 05/06/2021       Reactions   Amoxicillin-pot Clavulanate Diarrhea, Nausea And Vomiting   Pt states she can tolerate amoxicillin         Medication List        Accurate as of May 06, 2021  3:45 PM. If you have any questions, ask your nurse or doctor.          STOP taking these medications    azelastine 0.1 % nasal spray Commonly known as: ASTELIN Stopped by: Felix Pacini, DO   B COMPLEX 1 PO Stopped by: Felix Pacini, DO   cholecalciferol 25 MCG (1000 UNIT) tablet Commonly known as: VITAMIN D3 Stopped by: Felix Pacini, DO    doxycycline 100 MG tablet Commonly known as: VIBRA-TABS Stopped by: Felix Pacini, DO   ibuprofen 200 MG tablet Commonly known as: ADVIL Stopped by: Felix Pacini, DO   predniSONE 20 MG tablet Commonly known as: DELTASONE Stopped by: Felix Pacini, DO       TAKE these medications    APPLE CIDER VINEGAR PO Take by mouth. Gummies   ELDERBERRY PO Take by mouth. Airborne   ipratropium 0.06 % nasal spray Commonly known as: ATROVENT Place 2 sprays into both nostrils 4 (four) times daily as needed for rhinitis.   levocetirizine 5 MG tablet Commonly known as: Xyzal Take 1 tablet (5 mg total) by mouth at bedtime.   levonorgestrel 20 MCG/24HR IUD Commonly known as: MIRENA by Intrauterine route.   Omega 3 1000 MG Caps Take 1 capsule by mouth daily.   ONE-A-DAY WOMENS 50+ ADVANTAGE PO Take by mouth.   OVER THE COUNTER MEDICATION Herbal supplement to help move bowels- digestive herbal lax   propranolol 10 MG tablet Commonly known as: INDERAL 1 TAB 30 MIN PRIOR TO SPEAKING AS NEEDED   TURMERIC PO Take 2 tablets by mouth daily.   vitamin C 500 MG tablet Commonly known as: ASCORBIC ACID Take 500 mg by mouth daily.   zinc gluconate 50 MG tablet Take 50 mg by mouth daily.        All past medical history, surgical history, allergies, family history, immunizations andmedications were updated in the EMR today and reviewed under the history and medication portions of their EMR.     ROS Negative, with the exception of above mentioned in HPI  Objective:  BP 130/81   Pulse 61   Temp 98.2 F (36.8 C) (Oral)   Ht 5\' 1"  (1.549 m)   Wt 146 lb (66.2 kg)   SpO2 98%   BMI 27.59 kg/m  Body mass index is 27.59 kg/m. Physical Exam Vitals and nursing note reviewed.  Constitutional:      General: She is not in acute distress.    Appearance: Normal appearance. She is not ill-appearing, toxic-appearing or diaphoretic.  HENT:     Head: Normocephalic and atraumatic.      Mouth/Throat:     Mouth: Mucous membranes are moist.  Eyes:     General: No scleral icterus.       Right eye: No discharge.        Left eye: No discharge.     Extraocular Movements: Extraocular movements intact.     Conjunctiva/sclera: Conjunctivae normal.  Pupils: Pupils are equal, round, and reactive to light.  Cardiovascular:     Rate and Rhythm: Normal rate and regular rhythm.  Pulmonary:     Effort: Pulmonary effort is normal. No respiratory distress.     Breath sounds: Normal breath sounds. No wheezing, rhonchi or rales.  Musculoskeletal:     Cervical back: Neck supple. No tenderness.  Lymphadenopathy:     Cervical: No cervical adenopathy.  Skin:    General: Skin is warm and dry.     Coloration: Skin is not jaundiced or pale.     Findings: No erythema or rash.  Neurological:     Mental Status: She is alert and oriented to person, place, and time. Mental status is at baseline.     Motor: No weakness.     Gait: Gait normal.  Psychiatric:        Mood and Affect: Mood normal.        Behavior: Behavior normal.        Thought Content: Thought content normal.        Judgment: Judgment normal.    No results found. No results found. No results found for this or any previous visit (from the past 24 hour(s)).  Assessment/Plan: Shelly Hernandez is a 51 y.o. female present for OV for  Hyperlipidemia LDL goal <130/Overweight (BMI 25.0-29.9) Discussed dietary changes.  She was able to turn her cholesterol around in the past by following a pescatarian diet.  Lengthy conversation on dietary modifications and exercise. Patient was counseled on exercise, calorie counting, weight loss and potential medications to help with weight loss today. -Patient was encouraged to download calorie counting app. -Patient was educated on dietary changes to not only lose weight but to eat healthy.   - Patient was educated on glycemic index. -Patient was educated on exercise goal of 150 minutes a  week (plus warm up and cool down) of cardiovascular exercise.  Patient was educated on heart rate for cardiovascular and fat burning zones. -Patient was encouraged to maintain adequate water consumption of at least 100 ounces a day, more if exercising/sweating. - Cardio IQ Insulin Resistance Panel with Score; Future Patient was asked to start a food diary/log for 1 to 2 weeks and bring to next appointment with her for counseling.  Performance anxiety Stable. Continue propranolol as needed.  Chronic rhinitis Stable. Continue Xyzal. Continue Atrovent nasal spray as needed  Elevated fasting glucose - Cardio IQ Insulin Resistance Panel with Score; Future  Reviewed expectations re: course of current medical issues. Discussed self-management of symptoms. Outlined signs and symptoms indicating need for more acute intervention. Patient verbalized understanding and all questions were answered. Patient received an After-Visit Summary.    Orders Placed This Encounter  Procedures   Cardio IQ Insulin Resistance Panel with Score   Meds ordered this encounter  Medications   levocetirizine (XYZAL) 5 MG tablet    Sig: Take 1 tablet (5 mg total) by mouth at bedtime.    Dispense:  90 tablet    Refill:  3   ipratropium (ATROVENT) 0.06 % nasal spray    Sig: Place 2 sprays into both nostrils 4 (four) times daily as needed for rhinitis.    Dispense:  15 mL    Refill:  12   propranolol (INDERAL) 10 MG tablet    Sig: 1 TAB 30 MIN PRIOR TO SPEAKING AS NEEDED    Dispense:  90 tablet    Refill:  1   Referral Orders  No referral(s)  requested today     Note is dictated utilizing voice recognition software. Although note has been proof read prior to signing, occasional typographical errors still can be missed. If any questions arise, please do not hesitate to call for verification.   electronically signed by:  Felix Pacini, DO  [facility] [place]

## 2021-05-06 NOTE — Telephone Encounter (Signed)
Informed pt that she needs to be fasting.  ?

## 2021-05-06 NOTE — Patient Instructions (Signed)
Start a food log- everything you put in your mouth for 1-2 weeks.  ?Avoid 5 evils. ?Look at glycemic index and get familiar with low glycemic index fruits and veggies.  ? ?150 minutes a week of exercise is recommended. With heart rate 140s to burn calories/fat. ? ? ?Pick up scale and download app for counting.  ? ? ? ? ?

## 2021-05-06 NOTE — Telephone Encounter (Signed)
FYI: ?Pt was told to schedule a NURSE or LAB visit for insulin resistance test. ? ?Pt wanted to know does she need to fast for this? ? ?Pt cell: [phone] ?

## 2021-05-06 NOTE — Telephone Encounter (Signed)
Results give to PCP ?

## 2021-05-09 ENCOUNTER — Ambulatory Visit (INDEPENDENT_AMBULATORY_CARE_PROVIDER_SITE_OTHER): Payer: BC Managed Care – PPO

## 2021-05-09 DIAGNOSIS — R7301 Impaired fasting glucose: Secondary | ICD-10-CM

## 2021-05-09 DIAGNOSIS — E663 Overweight: Secondary | ICD-10-CM | POA: Diagnosis not present

## 2021-05-10 ENCOUNTER — Ambulatory Visit: Payer: BC Managed Care – PPO

## 2021-05-17 ENCOUNTER — Telehealth: Payer: Self-pay | Admitting: Family Medicine

## 2021-05-17 LAB — CARDIO IQ INSULIN RESISTANCE PANEL WITH SCORE
C-PEPTIDE, LC/MS/MS: 1.68 ng/mL (ref 0.68–2.16)
INSULIN, INTACT, LC/MS/MS: 3 u[IU]/mL (ref <=16)
Insulin Resistance Score: 13 (ref <=66)

## 2021-05-17 NOTE — Telephone Encounter (Signed)
Insulin resistance score is normal. ?We will discuss in more detail at her upcoming appointment. ?

## 2021-05-20 NOTE — Telephone Encounter (Signed)
Patient returning Toria's call from earlier this morning.  Please call [phone] whenever someone is available. ?

## 2021-05-20 NOTE — Telephone Encounter (Signed)
LVM for pt to CB regarding results.  

## 2021-05-20 NOTE — Telephone Encounter (Signed)
Spoke with pt regarding labs and instructions.   

## 2021-11-06 ENCOUNTER — Other Ambulatory Visit: Payer: Self-pay

## 2021-11-06 MED ORDER — PROPRANOLOL HCL 10 MG PO TABS: ORAL_TABLET | ORAL | 0 refills | Status: DC

## 2022-01-30 DIAGNOSIS — Z124 Encounter for screening for malignant neoplasm of cervix: Secondary | ICD-10-CM | POA: Diagnosis not present

## 2022-01-30 DIAGNOSIS — N959 Unspecified menopausal and perimenopausal disorder: Secondary | ICD-10-CM | POA: Diagnosis not present

## 2022-01-30 DIAGNOSIS — Z01419 Encounter for gynecological examination (general) (routine) without abnormal findings: Secondary | ICD-10-CM | POA: Diagnosis not present

## 2022-01-30 DIAGNOSIS — Z6828 Body mass index (BMI) 28.0-28.9, adult: Secondary | ICD-10-CM | POA: Diagnosis not present

## 2022-01-30 DIAGNOSIS — Z1231 Encounter for screening mammogram for malignant neoplasm of breast: Secondary | ICD-10-CM | POA: Diagnosis not present

## 2022-01-31 LAB — HM MAMMOGRAPHY

## 2022-03-07 ENCOUNTER — Telehealth: Payer: No Typology Code available for payment source | Admitting: Family Medicine

## 2022-03-07 DIAGNOSIS — H0100A Unspecified blepharitis right eye, upper and lower eyelids: Secondary | ICD-10-CM | POA: Diagnosis not present

## 2022-03-07 DIAGNOSIS — J019 Acute sinusitis, unspecified: Secondary | ICD-10-CM | POA: Diagnosis not present

## 2022-03-07 DIAGNOSIS — B9689 Other specified bacterial agents as the cause of diseases classified elsewhere: Secondary | ICD-10-CM | POA: Diagnosis not present

## 2022-03-07 MED ORDER — ERYTHROMYCIN 5 MG/GM OP OINT: 1.0000 | TOPICAL_OINTMENT | Freq: Every day | OPHTHALMIC | 0 refills | Status: DC

## 2022-03-07 MED ORDER — DOXYCYCLINE HYCLATE 100 MG PO TABS: 100.0000 mg | ORAL_TABLET | Freq: Two times a day (BID) | ORAL | 0 refills | Status: AC

## 2022-03-07 NOTE — Patient Instructions (Signed)
Shelly Hernandez, thank you for joining [name], NP for today's virtual visit.  While this provider is not your primary care provider (PCP), if your PCP is located in our provider database this encounter information will be shared with them immediately following your visit.   [name] account gives you access to today's visit and all your visits, tests, and labs performed at [facility] " click here if you don't have a [facility] account or go to [email or web address]  Consent: (Patient) Shelly Hernandez provided verbal consent for this virtual visit at the beginning of the encounter.  Current Medications:  Current Outpatient Medications:    doxycycline (VIBRA-TABS) 100 MG tablet, Take 1 tablet (100 mg total) by mouth 2 (two) times daily for 10 days., Disp: 20 tablet, Rfl: 0   erythromycin ophthalmic ointment, Place 1 Application into the right eye at bedtime., Disp: 3.5 g, Rfl: 0   APPLE CIDER VINEGAR PO, Take by mouth. Gummies, Disp: , Rfl:    ELDERBERRY PO, Take by mouth. Airborne, Disp: , Rfl:    ipratropium (ATROVENT) 0.06 % nasal spray, Place 2 sprays into both nostrils 4 (four) times daily as needed for rhinitis., Disp: 15 mL, Rfl: 12   levocetirizine (XYZAL) 5 MG tablet, Take 1 tablet (5 mg total) by mouth at bedtime., Disp: 90 tablet, Rfl: 3   levonorgestrel (MIRENA) 20 MCG/24HR IUD, by Intrauterine route., Disp: , Rfl:    Multiple Vitamins-Minerals (ONE-A-DAY WOMENS 50+ ADVANTAGE PO), Take by mouth., Disp: , Rfl:    Omega 3 1000 MG CAPS, Take 1 capsule by mouth daily., Disp: , Rfl:    OVER THE COUNTER MEDICATION, Herbal supplement to help move bowels- digestive herbal lax, Disp: , Rfl:    propranolol (INDERAL) 10 MG tablet, 1 TAB 30 MIN PRIOR TO SPEAKING AS NEEDED, Disp: 30 tablet, Rfl: 0   TURMERIC PO, Take 2 tablets by mouth daily., Disp: , Rfl:    vitamin C (ASCORBIC ACID) 500 MG tablet, Take 500 mg by mouth daily., Disp: , Rfl:    zinc  gluconate 50 MG tablet, Take 50 mg by mouth daily., Disp: , Rfl:   Current Facility-Administered Medications:    0.9 %  sodium chloride infusion, 500 mL, Intravenous, Once, [place], MD   Medications ordered in this encounter:  Meds ordered this encounter  Medications   doxycycline (VIBRA-TABS) 100 MG tablet    Sig: Take 1 tablet (100 mg total) by mouth 2 (two) times daily for 10 days.    Dispense:  20 tablet    Refill:  0    Order Specific Question:   Supervising Provider    Answer:   Chase Picket [id number]   erythromycin ophthalmic ointment    Sig: Place 1 Application into the right eye at bedtime.    Dispense:  3.5 g    Refill:  0    Order Specific Question:   Supervising Provider    Answer:   Chase Picket [[id number]]     *If you need refills on other medications prior to your next appointment, please contact your pharmacy*  Follow-Up: Call back or seek an in-person evaluation if the symptoms worsen or if the condition fails to improve as anticipated.  [place] [phone]  Other Instructions   -Take meds as prescribed -Rest -Use a cool mist humidifier especially during the winter months when heat dries out the air. - Use saline nose sprays frequently to help  soothe nasal passages and promote drainage. -Saline irrigations of the nose can be very helpful if done frequently.             * 4X daily for 1 week*             * Use of a nettie pot can be helpful with this.  *Follow directions with this* *Boiled or distilled water only -stay hydrated by drinking plenty of fluids - Keep thermostat turn down low to prevent drying out sinuses - For any cough or congestion- robitussin DM or Delsym as needed - For fever or aches or pains- take tylenol or ibuprofen as directed on bottle             * for fevers greater than 101 orally you may alternate ibuprofen and tylenol every 3 hours.  If you do not improve you will need a follow up visit in  person.                 If you have been instructed to have an in-person evaluation today at a local Urgent Care facility, please use the link below. It will take you to a list of all of our available [facility], including address, phone number and hours of operation. Please do not delay care.  [facility]  If you or a family member do not have a primary care provider, use the link below to schedule a visit and establish care. When you choose a [facility] primary care physician or advanced practice provider, you gain a long-term partner in health. Find a Primary Care Provider  Learn more about [facility]'s in-office and virtual care options: [facility]

## 2022-03-07 NOTE — Progress Notes (Signed)
Virtual Visit Consent   [place], you are scheduled for a virtual visit with a [facility] provider today. Just as with appointments in the office, your consent must be obtained to participate. Your consent will be active for this visit and any virtual visit you may have with one of our providers in the next 365 days. If you have a MyChart account, a copy of this consent can be sent to you electronically.  As this is a virtual visit, video technology does not allow for your provider to perform a traditional examination. This may limit your provider's ability to fully assess your condition. If your provider identifies any concerns that need to be evaluated in person or the need to arrange testing (such as labs, EKG, etc.), we will make arrangements to do so. Although advances in technology are sophisticated, we cannot ensure that it will always work on either your end or our end. If the connection with a video visit is poor, the visit may have to be switched to a telephone visit. With either a video or telephone visit, we are not always able to ensure that we have a secure connection.  By engaging in this virtual visit, you consent to the provision of healthcare and authorize for your insurance to be billed (if applicable) for the services provided during this visit. Depending on your insurance coverage, you may receive a charge related to this service.  I need to obtain your verbal consent now. Are you willing to proceed with your visit today? Shelly Hernandez has provided verbal consent on 03/07/2022 for a virtual visit (video or telephone). Shelly Mayo, NP  Date: 03/07/2022 9:33 AM  Virtual Visit via Video Note   I, Shelly Hernandez, connected with  Shelly Hernandez  ([id number], 11-28-1970) on 03/07/22 at  9:30 AM EST by a video-enabled telemedicine application and verified that I am speaking with the correct person using two identifiers.  Location: Patient: Virtual Visit Location Patient:  Home Provider: Virtual Visit Location Provider: Home Office   I discussed the limitations of evaluation and management by telemedicine and the availability of in person appointments. The patient expressed understanding and agreed to proceed.    History of Present Illness: Shelly Hernandez is a 52 y.o. who identifies as a female who was assigned female at birth, and is being seen today for sinus infection. Onset was two weeks ago- used Nasacort, Flonase at times, and OTC symptom management- xyzal and allegra. Recent flight as well- out in weather. Developed ear pain.  Denies fevers, chills, chest pain, shortness of breath   Problems:  Patient Active Problem List   Diagnosis Date Noted   Elevated fasting glucose 05/06/2021   Chronic rhinitis 07/12/2020   Frequent sinus infections 07/12/2020   Other adverse food reactions, not elsewhere classified, subsequent encounter 07/12/2020   Cough 07/30/2018   Performance anxiety 07/30/2018   Overweight (BMI 25.0-29.9) 08/04/2016   Hyperlipidemia LDL goal <130     Allergies:  Allergies  Allergen Reactions   Amoxicillin-Pot Clavulanate Diarrhea and Nausea And Vomiting    Pt states she can tolerate amoxicillin    Medications:  Current Outpatient Medications:    APPLE CIDER VINEGAR PO, Take by mouth. Gummies, Disp: , Rfl:    ELDERBERRY PO, Take by mouth. Airborne, Disp: , Rfl:    ipratropium (ATROVENT) 0.06 % nasal spray, Place 2 sprays into both nostrils 4 (four) times daily as needed for rhinitis., Disp: 15 mL, Rfl: 12  levocetirizine (XYZAL) 5 MG tablet, Take 1 tablet (5 mg total) by mouth at bedtime., Disp: 90 tablet, Rfl: 3   levonorgestrel (MIRENA) 20 MCG/24HR IUD, by Intrauterine route., Disp: , Rfl:    Multiple Vitamins-Minerals (ONE-A-DAY WOMENS 50+ ADVANTAGE PO), Take by mouth., Disp: , Rfl:    Omega 3 1000 MG CAPS, Take 1 capsule by mouth daily., Disp: , Rfl:    OVER THE COUNTER MEDICATION, Herbal supplement to help move bowels-  digestive herbal lax, Disp: , Rfl:    propranolol (INDERAL) 10 MG tablet, 1 TAB 30 MIN PRIOR TO SPEAKING AS NEEDED, Disp: 30 tablet, Rfl: 0   TURMERIC PO, Take 2 tablets by mouth daily., Disp: , Rfl:    vitamin C (ASCORBIC ACID) 500 MG tablet, Take 500 mg by mouth daily., Disp: , Rfl:    zinc gluconate 50 MG tablet, Take 50 mg by mouth daily., Disp: , Rfl:   Current Facility-Administered Medications:    0.9 %  sodium chloride infusion, 500 mL, Intravenous, Once, [place], MD  Observations/Objective: Patient is well-developed, well-nourished in no acute distress.  Resting comfortably  at home.  Head is normocephalic, atraumatic.  No labored breathing.  Speech is clear and coherent with logical content.  Patient is alert and oriented at baseline.    Assessment and Plan:  1. Acute bacterial sinusitis  - doxycycline (VIBRA-TABS) 100 MG tablet; Take 1 tablet (100 mg total) by mouth 2 (two) times daily for 10 days.  Dispense: 20 tablet; Refill: 0  2. Blepharitis of both upper and lower eyelid of right eye, unspecified type  - erythromycin ophthalmic ointment; Place 1 Application into the right eye at bedtime.  Dispense: 3.5 g; Refill: 0   -Take meds as prescribed -Rest -Use a cool mist humidifier especially during the winter months when heat dries out the air. - Use saline nose sprays frequently to help soothe nasal passages and promote drainage. -Saline irrigations of the nose can be very helpful if done frequently.             * 4X daily for 1 week*             * Use of a nettie pot can be helpful with this.  *Follow directions with this* *Boiled or distilled water only -stay hydrated by drinking plenty of fluids - Keep thermostat turn down low to prevent drying out sinuses - For any cough or congestion- robitussin DM or Delsym as needed - For fever or aches or pains- take tylenol or ibuprofen as directed on bottle             * for fevers greater than 101 orally you  may alternate ibuprofen and tylenol every 3 hours.  If you do not improve you will need a follow up visit in person.                 Reviewed side effects, risks and benefits of medication.    Patient acknowledged agreement and understanding of the plan.   Past Medical, Surgical, Social History, Allergies, and Medications have been Reviewed.    Follow Up Instructions: I discussed the assessment and treatment plan with the patient. The patient was provided an opportunity to ask questions and all were answered. The patient agreed with the plan and demonstrated an understanding of the instructions.  A copy of instructions were sent to the patient via MyChart unless otherwise noted below.    The patient was advised to call back or seek  an in-person evaluation if the symptoms worsen or if the condition fails to improve as anticipated.  Time:  I spent 10 minutes with the patient via telehealth technology discussing the above problems/concerns.    Shelly Mayo, NP

## 2022-04-11 ENCOUNTER — Encounter: Payer: Self-pay | Admitting: Emergency Medicine

## 2022-04-11 ENCOUNTER — Ambulatory Visit
Admission: EM | Admit: 2022-04-11 | Discharge: 2022-04-11 | Disposition: A | Discharge status: Home / Self Care | Payer: No Typology Code available for payment source | Attending: Urgent Care | Admitting: Urgent Care

## 2022-04-11 DIAGNOSIS — J329 Chronic sinusitis, unspecified: Secondary | ICD-10-CM

## 2022-04-11 DIAGNOSIS — J309 Allergic rhinitis, unspecified: Secondary | ICD-10-CM | POA: Diagnosis not present

## 2022-04-11 MED ORDER — CEFDINIR 300 MG PO CAPS: 300.0000 mg | ORAL_CAPSULE | Freq: Two times a day (BID) | ORAL | 0 refills | Status: DC

## 2022-04-11 MED ORDER — PREDNISONE 20 MG PO TABS: 40.0000 mg | ORAL_TABLET | Freq: Every day | ORAL | 0 refills | Status: DC

## 2022-04-11 NOTE — ED Triage Notes (Signed)
Patient c/o possible sinus infection, nasal drainage, bilateral ear pain x 1 week.  Patient has taken Nasacort, Allegra and Sudafed.

## 2022-04-11 NOTE — ED Provider Notes (Signed)
[facility]  Note:  This document was prepared using Dragon voice recognition software and may include unintentional dictation errors.  MRN: [id number] DOB: 1970-12-13  Subjective:   Shelly Hernandez is a 52 y.o. female presenting for 1 week history of recurrent sinus drainage, sinus pressure, bilateral ear fullness and pain, sinus pain.  Patient was treated for a sinusitis infection last month with doxycycline.  She feels that it has not resolved.  Has a history of chronic rhinitis and recurrent sinus infections.  Does not do well with amoxicillin, Augmentin.  Has allergic rhinitis which she tries to manage with Nasacort and Allegra, pseudoephedrine but is not consistent with medications.  No chest pain, shortness of breath or wheezing.  No smoking.   Current Facility-Administered Medications:    0.9 %  sodium chloride infusion, 500 mL, Intravenous, Once, [place], MD  Current Outpatient Medications:    APPLE CIDER VINEGAR PO, Take by mouth. Gummies, Disp: , Rfl:    ELDERBERRY PO, Take by mouth. Airborne, Disp: , Rfl:    levocetirizine (XYZAL) 5 MG tablet, Take 1 tablet (5 mg total) by mouth at bedtime., Disp: 90 tablet, Rfl: 3   levonorgestrel (MIRENA) 20 MCG/24HR IUD, by Intrauterine route., Disp: , Rfl:    Multiple Vitamins-Minerals (ONE-A-DAY WOMENS 50+ ADVANTAGE PO), Take by mouth., Disp: , Rfl:    Omega 3 1000 MG CAPS, Take 1 capsule by mouth daily., Disp: , Rfl:    propranolol (INDERAL) 10 MG tablet, 1 TAB 30 MIN PRIOR TO SPEAKING AS NEEDED, Disp: 30 tablet, Rfl: 0   TURMERIC PO, Take 2 tablets by mouth daily., Disp: , Rfl:    vitamin C (ASCORBIC ACID) 500 MG tablet, Take 500 mg by mouth daily., Disp: , Rfl:    zinc gluconate 50 MG tablet, Take 50 mg by mouth daily., Disp: , Rfl:    erythromycin ophthalmic ointment, Place 1 Application into the right eye at bedtime., Disp: 3.5 g, Rfl: 0   ipratropium (ATROVENT) 0.06 % nasal spray, Place 2 sprays into  both nostrils 4 (four) times daily as needed for rhinitis., Disp: 15 mL, Rfl: 12   OVER THE COUNTER MEDICATION, Herbal supplement to help move bowels- digestive herbal lax, Disp: , Rfl:    Allergies  Allergen Reactions   Amoxicillin-Pot Clavulanate Diarrhea and Nausea And Vomiting    Pt states she can tolerate amoxicillin     Past Medical History:  Diagnosis Date   Allergy    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   Arthritis    not diagnosed- hands toes    GERD (gastroesophageal reflux disease)    occ    Hyperlipidemia LDL goal <130    IBS (irritable bowel syndrome)      Past Surgical History:  Procedure Laterality Date   EYE SURGERY  03/2019   REDUCTION MAMMAPLASTY  1990    Family History  Problem Relation Age of Onset   Diabetes Mother    Arthritis Mother    Diabetes Father    Hearing loss Father    Heart disease Father    Colon polyps Father    Angioedema Father    Colon polyps Sister    Bipolar disorder Son    Diabetes Maternal Aunt    Arthritis Maternal Aunt    Heart disease Maternal Uncle    Diabetes Paternal Aunt    Stroke Maternal Grandmother    Arthritis Maternal Grandmother    Esophageal cancer Maternal  Grandfather    COPD Maternal Grandfather    Diabetes Paternal Grandmother    Heart disease Paternal Grandfather    Colon cancer Neg Hx    Stomach cancer Neg Hx    Rectal cancer Neg Hx     Social History   Tobacco Use   Smoking status: Never   Smokeless tobacco: Never  Vaping Use   Vaping Use: Never used  Substance Use Topics   Alcohol use: Yes    Alcohol/week: 6.0 standard drinks of alcohol    Types: 4 Cans of beer, 2 Shots of liquor per week   Drug use: No    ROS   Objective:   Vitals: BP (!) 156/89 (BP Location: Right Arm)   Pulse (!) 57   Temp 98.3 F (36.8 C) (Oral)   Resp 18   Ht 5' (1.524 m)   Wt 140 lb (63.5 kg)   SpO2 99%   BMI 27.34 kg/m   Physical Exam Constitutional:      General: She is not in acute  distress.    Appearance: Normal appearance. She is well-developed and normal weight. She is not ill-appearing, toxic-appearing or diaphoretic.  HENT:     Head: Normocephalic and atraumatic.     Right Ear: Tympanic membrane, ear canal and external ear normal. No drainage or tenderness. No middle ear effusion. There is no impacted cerumen. Tympanic membrane is not erythematous or bulging.     Left Ear: Tympanic membrane, ear canal and external ear normal. No drainage or tenderness.  No middle ear effusion. There is no impacted cerumen. Tympanic membrane is not erythematous or bulging.     Nose: Congestion and rhinorrhea present.     Mouth/Throat:     Mouth: Mucous membranes are moist. No oral lesions.     Pharynx: No pharyngeal swelling, oropharyngeal exudate, posterior oropharyngeal erythema or uvula swelling.     Tonsils: No tonsillar exudate or tonsillar abscesses.  Eyes:     General: No scleral icterus.       Right eye: No discharge.        Left eye: No discharge.     Extraocular Movements: Extraocular movements intact.     Right eye: Normal extraocular motion.     Left eye: Normal extraocular motion.     Conjunctiva/sclera: Conjunctivae normal.  Cardiovascular:     Rate and Rhythm: Normal rate and regular rhythm.     Heart sounds: Normal heart sounds. No murmur heard.    No friction rub. No gallop.  Pulmonary:     Effort: Pulmonary effort is normal. No respiratory distress.     Breath sounds: No stridor. No wheezing, rhonchi or rales.  Chest:     Chest wall: No tenderness.  Musculoskeletal:     Cervical back: Normal range of motion and neck supple.  Lymphadenopathy:     Cervical: No cervical adenopathy.  Skin:    General: Skin is warm and dry.  Neurological:     General: No focal deficit present.     Mental Status: She is alert and oriented to person, place, and time.  Psychiatric:        Mood and Affect: Mood normal.        Behavior: Behavior normal.     Assessment and  Plan :   PDMP not reviewed this encounter.  1. Recurrent sinusitis   2. Allergic rhinitis, unspecified seasonality, unspecified trigger     Discussed antibiotic stewardship and the risks of frequent antibiotic use.  Patient  verbalized understanding.  Recommended an oral prednisone course as I suspect the problem is more chronic rhinitis, allergic rhinitis.  Will provide her with a prescription for cefdinir in the event she has no improvement and can address bacterial sinusitis with this antibiotic. Counseled patient on potential for adverse effects with medications prescribed/recommended today, ER and return-to-clinic precautions discussed, patient verbalized understanding.    Jaynee Eagles, [place] 04/11/22 [phone]

## 2023-02-26 LAB — HM MAMMOGRAPHY

## 2023-04-29 LAB — CBC AND DIFFERENTIAL
HCT: 38 (ref 36–46)
Hemoglobin: 12.5 (ref 12.0–16.0)
Neutrophils Absolute: 3.8
Platelets: 260 10*3/uL (ref 150–400)
WBC: 6.7

## 2023-04-29 LAB — TSH
Free T4: 6.8 ng/dL
TSH: 3.08 (ref 0.41–5.90)

## 2023-04-29 LAB — BASIC METABOLIC PANEL WITH GFR
BUN: 17 (ref 4–21)
Chloride: 106 (ref 99–108)
Creatinine: 0.8 (ref 0.5–1.1)
Glucose: 96
Potassium: 4.7 meq/L (ref 3.5–5.1)
Sodium: 141 (ref 137–147)

## 2023-04-29 LAB — HEPATIC FUNCTION PANEL
ALT: 14 U/L (ref 7–35)
AST: 19 (ref 13–35)
Alkaline Phosphatase: 39 (ref 25–125)
Bilirubin, Total: 0.3

## 2023-04-29 LAB — COMPREHENSIVE METABOLIC PANEL WITH GFR
Albumin: 4.3 (ref 3.5–5.0)
Calcium: 9.3 (ref 8.7–10.7)
Globulin: 2.4

## 2023-04-29 LAB — LIPID PANEL
Cholesterol: 237 — AB (ref 0–200)
HDL: 59 (ref 35–70)
LDL Cholesterol: 139
Triglycerides: 219 — AB (ref 40–160)

## 2023-04-29 LAB — CBC: RBC: 4.19 (ref 3.87–5.11)

## 2023-05-18 ENCOUNTER — Encounter: Payer: Self-pay | Admitting: Family Medicine

## 2023-05-19 NOTE — Telephone Encounter (Signed)
 Please call patient: We did receive the copy of her labs she provided. Patient has not been seen at this office in over 2 years. If she is desiring to continue care here and is asking for medical advice, would encourage her to schedule an appointment to discuss.

## 2023-05-20 NOTE — Telephone Encounter (Signed)
 No further action needed.

## 2023-06-04 ENCOUNTER — Ambulatory Visit (INDEPENDENT_AMBULATORY_CARE_PROVIDER_SITE_OTHER): Admitting: Family Medicine

## 2023-06-04 ENCOUNTER — Encounter: Payer: Self-pay | Admitting: Family Medicine

## 2023-06-04 VITALS — BP 132/84 | HR 60 | Temp 98.2°F | Wt 126.0 lb

## 2023-06-04 DIAGNOSIS — H0100A Unspecified blepharitis right eye, upper and lower eyelids: Secondary | ICD-10-CM | POA: Diagnosis not present

## 2023-06-04 DIAGNOSIS — E785 Hyperlipidemia, unspecified: Secondary | ICD-10-CM | POA: Diagnosis not present

## 2023-06-04 DIAGNOSIS — Z1159 Encounter for screening for other viral diseases: Secondary | ICD-10-CM

## 2023-06-04 DIAGNOSIS — Z0000 Encounter for general adult medical examination without abnormal findings: Secondary | ICD-10-CM

## 2023-06-04 DIAGNOSIS — F418 Other specified anxiety disorders: Secondary | ICD-10-CM | POA: Diagnosis not present

## 2023-06-04 DIAGNOSIS — Z23 Encounter for immunization: Secondary | ICD-10-CM

## 2023-06-04 DIAGNOSIS — H00019 Hordeolum externum unspecified eye, unspecified eyelid: Secondary | ICD-10-CM | POA: Insufficient documentation

## 2023-06-04 MED ORDER — PROPRANOLOL HCL 10 MG PO TABS: ORAL_TABLET | ORAL | 11 refills | Status: AC

## 2023-06-04 MED ORDER — ERYTHROMYCIN 5 MG/GM OP OINT: 1.0000 | TOPICAL_OINTMENT | Freq: Every day | OPHTHALMIC | 0 refills | Status: AC

## 2023-06-04 NOTE — Patient Instructions (Addendum)
 Return in about 15 weeks (around 09/17/2023) for nurse visit shngrix #2.  Reviewed all labs with patient today from labs collected at work.        Great to see you today.  I have refilled the medication(s) we provide.   If labs were collected or images ordered, we will inform you of  results once we have received them and reviewed. We will contact you either by echart message, or telephone call.  Please give ample time to the testing facility, and our office to run,  receive and review results. Please do not call inquiring of results, even if you can see them in your chart. We will contact you as soon as we are able. If it has been over 1 week since the test was completed, and you have not yet heard from us , then please call us .    - echart message- for normal results that have been seen by the patient already.   - telephone call: abnormal results or if patient has not viewed results in their echart.  If a referral to a specialist was entered for you, please call us  in 2 weeks if you have not heard from the specialist office to schedule.

## 2023-06-04 NOTE — Progress Notes (Signed)
 //   Patient ID: Shelly Hernandez, female  DOB: [birth year], 53 y.o.   MRN: [id number] Patient Care Team    Relationship Specialty Notifications Start End  Mariel Shope, DO PCP - General Family Medicine  08/04/16   Meriam Stamp, MD Consulting Physician Obstetrics and Gynecology  08/04/16   Virgina Grills, MD Consulting Physician Otolaryngology  02/03/17     Chief Complaint  Patient presents with   Annual Exam    Subjective:  Shelly Hernandez is a 53 y.o.  Female  present for CPE . All past medical history, surgical history, allergies, family history, immunizations, medications and social history were updated in the electronic medical record today. All recent labs, ED visits and hospitalizations within the last year were reviewed.  Health maintenance:  Colonoscopy: completed 05/24/2020> dr. Savannah Curlin, polyps present. 5  yr follow up.   Mammogram: completed:02/26/2023, GYN Cervical cancer screening:  completed by: taavon> requested Immunizations: tdap completed today 06/04/2023, Influenza  (encouraged yearly), shingrix #1  06/04/2023> nurse visit 2-6 mos for #2 Infectious disease screening: HIV and, Hep C -declined DEXA: routine screen recommended Assistive device: none Oxygen JYN:[facility] Patient has a Dental home. 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   Arthritis    not diagnosed- hands toes    GERD (gastroesophageal reflux disease)     occ    Hyperlipidemia LDL goal <130    IBS (irritable bowel syndrome)    Allergies  Allergen Reactions   Amoxicillin-Pot Clavulanate Diarrhea and Nausea And Vomiting    Pt states she can tolerate amoxicillin    Past Surgical History:  Procedure Laterality Date   EYE SURGERY  03/2019   REDUCTION MAMMAPLASTY  1990   Family History  Problem Relation Age of Onset   Diabetes Mother    Arthritis Mother    Diabetes Father    Hearing loss Father    Heart disease Father    Colon polyps Father    Angioedema Father    Colon polyps Sister    Bipolar disorder Son    Diabetes Maternal Aunt    Arthritis Maternal Aunt    Heart disease Maternal Uncle    Diabetes Paternal Aunt    Stroke Maternal Grandmother    Arthritis Maternal Grandmother    Esophageal cancer Maternal Grandfather    COPD Maternal Grandfather    Diabetes Paternal Grandmother    Heart disease Paternal Grandfather    Colon cancer Neg Hx    Stomach cancer Neg Hx    Rectal cancer Neg Hx    Social History   Social History Narrative   Married to [place]. 1 son Mammie Sears.    B.A. Degree, works as a Psychologist, occupational.    Drinks caffeine. Daily vitamin use.   Wears her seatbelt, bicycle helmet, smoke detector in  the home. Firearms locked in the home.    Feels safe in her relationships.     Allergies as of 06/04/2023       Reactions   Amoxicillin-pot Clavulanate Diarrhea, Nausea And Vomiting   Pt states she can tolerate amoxicillin         Medication List        Accurate as of June 04, 2023 10:03 AM. If you have any questions, ask your nurse or doctor.          STOP taking these medications    cefdinir 300 MG capsule Commonly known as: OMNICEF Stopped by: Felix Pacini   ipratropium 0.06 % nasal spray Commonly known as: ATROVENT Stopped by: Felix Pacini   predniSONE 20 MG tablet Commonly known as: DELTASONE Stopped by: Felix Pacini       TAKE these medications    APPLE CIDER VINEGAR PO Take by mouth.  Gummies   ascorbic acid 500 MG tablet Commonly known as: VITAMIN C Take 500 mg by mouth daily.   ELDERBERRY PO Take by mouth. Airborne   erythromycin ophthalmic ointment Place 1 Application into the right eye at bedtime.   estradiol 1 MG tablet Commonly known as: ESTRACE Take 1 mg by mouth daily.   fluticasone 50 MCG/ACT nasal spray Commonly known as: FLONASE 1 spray Once Daily.   levocetirizine 5 MG tablet Commonly known as: Xyzal Take 1 tablet (5 mg total) by mouth at bedtime.   levonorgestrel 20 MCG/24HR IUD Commonly known as: MIRENA by Intrauterine route.   Omega 3 1000 MG Caps Take 1 capsule by mouth daily.   ONE-A-DAY WOMENS 50+ ADVANTAGE PO Take by mouth.   OVER THE COUNTER MEDICATION Herbal supplement to help move bowels- digestive herbal lax   propranolol 10 MG tablet Commonly known as: INDERAL 1 TAB 30 MIN PRIOR TO SPEAKING AS NEEDED   TURMERIC PO Take 2 tablets by mouth daily.   zinc gluconate 50 MG tablet Take 50 mg by mouth daily.        All past medical history, surgical history, allergies, family history, immunizations andmedications were updated in the EMR today and reviewed under the history and medication portions of their EMR.     Recent Results (from the past 2160 hours)  TSH     Status: None   Collection Time: 04/28/23 12:00 AM  Result Value Ref Range   TSH 3.08 0.41 - 5.90   Free T4 6.8 ng/dL    Comment: Abstracted by HIM  CBC and differential     Status: None   Collection Time: 04/29/23 12:00 AM  Result Value Ref Range   Hemoglobin 12.5 12.0 - 16.0   HCT 38 36 - 46   Neutrophils Absolute 3.80    Platelets 260 150 - 400 K/uL   WBC 6.7   CBC     Status: None   Collection Time: 04/29/23 12:00 AM  Result Value Ref Range   RBC 4.19 3.87 - 5.11  Basic metabolic panel with GFR     Status: None   Collection Time: 04/29/23 12:00 AM  Result Value Ref Range   Glucose 96    BUN 17 4 - 21   Creatinine 0.8 0.5 - 1.1   Potassium  4.7 3.5 - 5.1 mEq/L   Sodium 141 137 - 147   Chloride 106 99 - 108  Comprehensive metabolic panel with GFR     Status: None   Collection Time: 04/29/23 12:00 AM  Result Value Ref  Range   Globulin 2.4    Calcium 9.3 8.7 - 10.7   Albumin 4.3 3.5 - 5.0  Lipid panel     Status: Abnormal   Collection Time: 04/29/23 12:00 AM  Result Value Ref Range   Triglycerides 219 (A) 40 - 160   Cholesterol 237 (A) 0 - 200   HDL 59 35 - 70   LDL Cholesterol 139   Hepatic function panel     Status: None   Collection Time: 04/29/23 12:00 AM  Result Value Ref Range   Alkaline Phosphatase 39 25 - 125   ALT 14 7 - 35 U/L   AST 19 13 - 35   Bilirubin, Total 0.3     No results found.   ROS 14 pt review of systems performed and negative (unless mentioned in an HPI)  Objective: BP 132/84   Pulse 60   Temp 98.2 F (36.8 C)   Wt 126 lb (57.2 kg)   SpO2 99%   BMI 24.61 kg/m  Physical Exam Vitals and nursing note reviewed.  Constitutional:      General: She is not in acute distress.    Appearance: Normal appearance. She is not ill-appearing or toxic-appearing.  HENT:     Head: Normocephalic and atraumatic.     Right Ear: Tympanic membrane, ear canal and external ear normal. There is no impacted cerumen.     Left Ear: Tympanic membrane, ear canal and external ear normal. There is no impacted cerumen.     Nose: No congestion or rhinorrhea.     Mouth/Throat:     Mouth: Mucous membranes are moist.     Pharynx: Oropharynx is clear. No oropharyngeal exudate or posterior oropharyngeal erythema.  Eyes:     General: No scleral icterus.       Right eye: No discharge.        Left eye: No discharge.     Extraocular Movements: Extraocular movements intact.     Conjunctiva/sclera: Conjunctivae normal.     Pupils: Pupils are equal, round, and reactive to light.  Cardiovascular:     Rate and Rhythm: Normal rate and regular rhythm.     Pulses: Normal pulses.     Heart sounds: Normal heart sounds. No  murmur heard.    No friction rub. No gallop.  Pulmonary:     Effort: Pulmonary effort is normal. No respiratory distress.     Breath sounds: Normal breath sounds. No stridor. No wheezing, rhonchi or rales.  Chest:     Chest wall: No tenderness.  Abdominal:     General: Abdomen is flat. Bowel sounds are normal. There is no distension.     Palpations: Abdomen is soft. There is no mass.     Tenderness: There is no abdominal tenderness. There is no right CVA tenderness, left CVA tenderness, guarding or rebound.     Hernia: No hernia is present.  Musculoskeletal:        General: No swelling, tenderness or deformity. Normal range of motion.     Cervical back: Normal range of motion and neck supple. No rigidity or tenderness.     Right lower leg: No edema.     Left lower leg: No edema.  Lymphadenopathy:     Cervical: No cervical adenopathy.  Skin:    General: Skin is warm and dry.     Coloration: Skin is not jaundiced or pale.     Findings: No bruising, erythema, lesion or rash.  Neurological:  General: No focal deficit present.     Mental Status: She is alert and oriented to person, place, and time. Mental status is at baseline.     Cranial Nerves: No cranial nerve deficit.     Sensory: No sensory deficit.     Motor: No weakness.     Coordination: Coordination normal.     Gait: Gait normal.     Deep Tendon Reflexes: Reflexes normal.  Psychiatric:        Mood and Affect: Mood normal.        Behavior: Behavior normal.        Thought Content: Thought content normal.        Judgment: Judgment normal.      No results found.  Assessment/plan: Shelly Hernandez is a 53 y.o. female present for CPE and Chronic Conditions/illness Management Hyperlipidemia LDL goal <130 Doing much better, lost 20 lbs.  Need for zoster vaccination - Varicella-zoster vaccine IM Need for Tdap vaccination - Tdap vaccine greater than or equal to 7yo IM Encounter for hepatitis C screening test for low  risk patient declined Routine general medical examination at a health care facility (Primary) Patient was encouraged to exercise greater than 150 minutes a week. Patient was encouraged to choose a diet filled with fresh fruits and vegetables, and lean meats. AVS provided to patient today for education/recommendation on gender specific health and safety maintenance. Colonoscopy: completed 05/24/2020> dr. Savannah Curlin, polyps present. 5  yr follow up.   Mammogram: completed:02/26/2023, GYN Cervical cancer screening:  completed by: taavon> requested Immunizations: tdap completed today 06/04/2023, Influenza  (encouraged yearly), shingrix #1  06/04/2023> nurse visit 2-6 mos for #2 Infectious disease screening: HIV and, Hep C -declined DEXA: routine screen recommended  Performance anxiety: Request refills on propranolol - uses for public speaking.  Refilled for her today  Stye: Recurrent styes, likes to have erythromycin ointment on hand , Refilled for he today Return in about 15 weeks (around 09/17/2023) for nurse visit shngrix #2. CPE in a year  Orders Placed This Encounter  Procedures   Tdap vaccine greater than or equal to 7yo IM   Varicella-zoster vaccine IM    Orders Placed This Encounter  Procedures   Tdap vaccine greater than or equal to 7yo IM   Varicella-zoster vaccine IM   Meds ordered this encounter  Medications   propranolol (INDERAL) 10 MG tablet    Sig: 1 TAB 30 MIN PRIOR TO SPEAKING AS NEEDED    Dispense:  30 tablet    Refill:  11   erythromycin ophthalmic ointment    Sig: Place 1 Application into the right eye at bedtime.    Dispense:  3.5 g    Refill:  0   Referral Orders  No referral(s) requested today     Electronically signed by: Napolean Backbone, DO [facility]

## 2023-09-17 ENCOUNTER — Ambulatory Visit

## 2023-09-17 DIAGNOSIS — Z23 Encounter for immunization: Secondary | ICD-10-CM

## 2023-09-17 NOTE — Progress Notes (Signed)
 Pt here for Shingrix  vaccine #2.   Injection given L deltoid IM, pt tolerated injection well.

## 2024-03-14 ENCOUNTER — Ambulatory Visit: Payer: Self-pay
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
## 2024-03-15 ENCOUNTER — Encounter: Payer: Self-pay | Admitting: Family Medicine

## 2024-03-15 ENCOUNTER — Ambulatory Visit: Payer: Self-pay | Admitting: Family Medicine

## 2024-03-15 ENCOUNTER — Ambulatory Visit: Admitting: Family Medicine

## 2024-03-15 ENCOUNTER — Ambulatory Visit (HOSPITAL_BASED_OUTPATIENT_CLINIC_OR_DEPARTMENT_OTHER)
Admission: RE | Admit: 2024-03-15 | Discharge: 2024-03-15 | Disposition: A | Discharge status: Home / Self Care | Source: Ambulatory Visit | Attending: Family Medicine | Admitting: Family Medicine

## 2024-03-15 VITALS — BP 120/70 | HR 54 | Temp 97.1°F | Ht 60.0 in | Wt 134.6 lb

## 2024-03-15 DIAGNOSIS — W19XXXA Unspecified fall, initial encounter: Secondary | ICD-10-CM | POA: Insufficient documentation

## 2024-03-15 DIAGNOSIS — R0789 Other chest pain: Secondary | ICD-10-CM

## 2024-03-15 DIAGNOSIS — M940 Chondrocostal junction syndrome [Tietze]: Secondary | ICD-10-CM

## 2024-03-15 MED ORDER — CYCLOBENZAPRINE HCL 10 MG PO TABS: 10.0000 mg | ORAL_TABLET | Freq: Three times a day (TID) | ORAL | 0 refills | Status: AC | PRN

## 2024-03-15 MED ORDER — NAPROXEN 500 MG PO TABS: 500.0000 mg | ORAL_TABLET | Freq: Two times a day (BID) | ORAL | 0 refills | Status: AC

## 2024-03-15 NOTE — Telephone Encounter (Signed)
 Please inform patient her chest x-ray did show a rib fracture in the left fourth rib between her breast and armpit area. Recommend she take the naproxen  and the muscle relaxer as we discussed. Would avoid lifting> Rest, ice and breathing exercises recommended.  Most people need at least a month to recover from a rib fracture F/u 4 prn

## 2024-03-15 NOTE — Patient Instructions (Signed)
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
## 2024-06-06 ENCOUNTER — Encounter: Admitting: Family Medicine
